# Patient Record
Sex: Female | Born: 1945 | Race: Black or African American | Hispanic: No | Marital: Married | Smoking: Never smoker
Health system: Southern US, Community
[De-identification: ages and names within clinical notes are randomized; demographics above are authoritative.]

## PROBLEM LIST (undated history)

## (undated) DIAGNOSIS — Z923 Personal history of irradiation: Secondary | ICD-10-CM

## (undated) DIAGNOSIS — E7800 Pure hypercholesterolemia, unspecified: Secondary | ICD-10-CM

## (undated) DIAGNOSIS — Z9221 Personal history of antineoplastic chemotherapy: Secondary | ICD-10-CM

## (undated) DIAGNOSIS — C50919 Malignant neoplasm of unspecified site of unspecified female breast: Secondary | ICD-10-CM

---

## 1993-12-27 HISTORY — PX: MASTECTOMY: SHX3

## 2014-01-02 ENCOUNTER — Other Ambulatory Visit: Payer: Self-pay

## 2014-01-02 DIAGNOSIS — Z1231 Encounter for screening mammogram for malignant neoplasm of breast: Secondary | ICD-10-CM

## 2014-01-04 ENCOUNTER — Other Ambulatory Visit: Payer: Self-pay

## 2014-01-04 ENCOUNTER — Ambulatory Visit
Admission: RE | Admit: 2014-01-04 | Discharge: 2014-01-04 | Disposition: A | Discharge status: Home / Self Care | Payer: Medicare Other | Source: Ambulatory Visit

## 2014-01-04 DIAGNOSIS — Z1231 Encounter for screening mammogram for malignant neoplasm of breast: Secondary | ICD-10-CM

## 2014-11-07 ENCOUNTER — Other Ambulatory Visit (HOSPITAL_COMMUNITY)
Admission: RE | Admit: 2014-11-07 | Discharge: 2014-11-07 | Disposition: A | Discharge status: Home / Self Care | Payer: Medicare Other | Source: Ambulatory Visit | Attending: Family Medicine | Admitting: Family Medicine

## 2014-11-07 ENCOUNTER — Other Ambulatory Visit: Payer: Self-pay | Admitting: Family Medicine

## 2014-11-07 DIAGNOSIS — Z01419 Encounter for gynecological examination (general) (routine) without abnormal findings: Secondary | ICD-10-CM | POA: Diagnosis present

## 2014-11-11 LAB — CYTOLOGY - PAP

## 2015-01-07 ENCOUNTER — Other Ambulatory Visit: Payer: Self-pay

## 2015-01-07 DIAGNOSIS — Z1231 Encounter for screening mammogram for malignant neoplasm of breast: Secondary | ICD-10-CM

## 2015-01-13 ENCOUNTER — Ambulatory Visit
Admission: RE | Admit: 2015-01-13 | Discharge: 2015-01-13 | Disposition: A | Discharge status: Home / Self Care | Payer: Medicare Other | Source: Ambulatory Visit

## 2015-01-13 ENCOUNTER — Other Ambulatory Visit: Payer: Self-pay

## 2015-01-13 DIAGNOSIS — Z1239 Encounter for other screening for malignant neoplasm of breast: Secondary | ICD-10-CM

## 2015-01-13 DIAGNOSIS — Z1231 Encounter for screening mammogram for malignant neoplasm of breast: Secondary | ICD-10-CM

## 2015-12-17 ENCOUNTER — Other Ambulatory Visit (HOSPITAL_COMMUNITY): Payer: Self-pay | Admitting: Nurse Practitioner

## 2015-12-17 DIAGNOSIS — B1920 Unspecified viral hepatitis C without hepatic coma: Secondary | ICD-10-CM

## 2016-01-21 ENCOUNTER — Ambulatory Visit (HOSPITAL_COMMUNITY)
Admission: RE | Admit: 2016-01-21 | Discharge: 2016-01-21 | Disposition: A | Discharge status: Home / Self Care | Payer: Medicare Other | Source: Ambulatory Visit | Attending: Nurse Practitioner | Admitting: Nurse Practitioner

## 2016-01-21 DIAGNOSIS — N281 Cyst of kidney, acquired: Secondary | ICD-10-CM | POA: Insufficient documentation

## 2016-01-21 DIAGNOSIS — B1920 Unspecified viral hepatitis C without hepatic coma: Secondary | ICD-10-CM

## 2016-02-23 ENCOUNTER — Other Ambulatory Visit: Payer: Self-pay

## 2016-02-23 DIAGNOSIS — Z1231 Encounter for screening mammogram for malignant neoplasm of breast: Secondary | ICD-10-CM

## 2016-03-17 ENCOUNTER — Ambulatory Visit
Admission: RE | Admit: 2016-03-17 | Discharge: 2016-03-17 | Disposition: A | Discharge status: Home / Self Care | Payer: Medicare Other | Source: Ambulatory Visit

## 2016-03-17 DIAGNOSIS — Z1231 Encounter for screening mammogram for malignant neoplasm of breast: Secondary | ICD-10-CM

## 2016-12-28 ENCOUNTER — Ambulatory Visit (INDEPENDENT_AMBULATORY_CARE_PROVIDER_SITE_OTHER): Payer: Medicare Other | Admitting: Podiatry

## 2016-12-28 ENCOUNTER — Encounter: Payer: Self-pay | Admitting: Podiatry

## 2016-12-28 VITALS — BP 171/88 | HR 72 | Ht 64.0 in | Wt 150.0 lb

## 2016-12-28 DIAGNOSIS — S91209A Unspecified open wound of unspecified toe(s) with damage to nail, initial encounter: Secondary | ICD-10-CM | POA: Diagnosis not present

## 2016-12-28 DIAGNOSIS — B351 Tinea unguium: Secondary | ICD-10-CM

## 2016-12-28 DIAGNOSIS — M79675 Pain in left toe(s): Secondary | ICD-10-CM

## 2016-12-28 DIAGNOSIS — M79674 Pain in right toe(s): Secondary | ICD-10-CM

## 2016-12-28 NOTE — Progress Notes (Signed)
SUBJECTIVE: 71 y.o. year old female presents complaining of painful loose nail. She keep bumping and nails and they are loose.  REVIEW OF SYSTEMS: Pertinent items noted in HPI and remainder of comprehensive ROS otherwise negative.  OBJECTIVE: DERMATOLOGIC EXAMINATION: Nails: Loose nail both great toe with fungal debris under nail plate.  VASCULAR EXAMINATION OF LOWER LIMBS: All pedal pulses are palpable with normal pulsation.  Capillary Filling times within 3 seconds in all digits.  No edema or erythema noted. Temperature gradient from tibial crest to dorsum of foot is within normal bilateral.  NEUROLOGIC EXAMINATION OF THE LOWER LIMBS: All epicritic and tactile sensations grossly intact.  Sharp and Dull discriminatory sensations at the plantar ball of hallux is intact bilateral.   MUSCULOSKELETAL EXAMINATION: Positive long and hyperextended great toe bilateral.   ASSESSMENT: Onychomycosis both great toe nail. Loose nail from microtrauma both great toes. Hyperextended both great toes. Painful toes.  PLAN: Reviewed findings and available treatment options. Reviewed proper shoe selection. All nails debrided. Return as needed.

## 2016-12-28 NOTE — Patient Instructions (Signed)
Deformed loose toe nails both great toe from hyperextended great toes. All nails debrided. Proper shoe selection reviewed. Return as needed.

## 2017-02-25 ENCOUNTER — Other Ambulatory Visit: Payer: Self-pay | Admitting: Family Medicine

## 2017-02-25 DIAGNOSIS — Z1231 Encounter for screening mammogram for malignant neoplasm of breast: Secondary | ICD-10-CM

## 2017-03-21 ENCOUNTER — Ambulatory Visit
Admission: RE | Admit: 2017-03-21 | Discharge: 2017-03-21 | Disposition: A | Discharge status: Home / Self Care | Payer: Medicare Other | Source: Ambulatory Visit | Attending: Family Medicine | Admitting: Family Medicine

## 2017-03-21 DIAGNOSIS — Z1231 Encounter for screening mammogram for malignant neoplasm of breast: Secondary | ICD-10-CM

## 2017-03-21 HISTORY — DX: Personal history of irradiation: Z92.3

## 2017-03-21 HISTORY — DX: Personal history of antineoplastic chemotherapy: Z92.21

## 2018-02-20 ENCOUNTER — Other Ambulatory Visit: Payer: Self-pay | Admitting: Family Medicine

## 2018-02-20 DIAGNOSIS — Z1231 Encounter for screening mammogram for malignant neoplasm of breast: Secondary | ICD-10-CM

## 2018-03-24 ENCOUNTER — Ambulatory Visit
Admission: RE | Admit: 2018-03-24 | Discharge: 2018-03-24 | Disposition: A | Discharge status: Home / Self Care | Payer: Medicare Other | Source: Ambulatory Visit | Attending: Family Medicine | Admitting: Family Medicine

## 2018-03-24 DIAGNOSIS — Z1231 Encounter for screening mammogram for malignant neoplasm of breast: Secondary | ICD-10-CM

## 2018-04-26 ENCOUNTER — Encounter: Payer: Self-pay | Admitting: Podiatry

## 2018-04-26 ENCOUNTER — Ambulatory Visit (INDEPENDENT_AMBULATORY_CARE_PROVIDER_SITE_OTHER): Payer: Medicare Other | Admitting: Podiatry

## 2018-04-26 DIAGNOSIS — M79675 Pain in left toe(s): Secondary | ICD-10-CM

## 2018-04-26 DIAGNOSIS — B351 Tinea unguium: Secondary | ICD-10-CM

## 2018-04-26 DIAGNOSIS — M79674 Pain in right toe(s): Secondary | ICD-10-CM | POA: Diagnosis not present

## 2018-04-26 NOTE — Patient Instructions (Signed)
Seen for hypertrophic nails. All nails debrided. Return as needed.  

## 2018-04-26 NOTE — Progress Notes (Signed)
Subjective: 72 y.o. year old female patient presents complaining of painful nails. Patient requests toe nails trimmed.   Objective: Dermatologic: Thick yellow deformed nails x 10. Vascular: Pedal pulses are all palpable. Orthopedic: Contracted lesser digits  Neurologic: All epicritic and tactile sensations grossly intact.  Assessment: Dystrophic mycotic nails x 10. Painful toes.  Treatment: All mycotic nails debrided.  Return in 3 months or as needed.

## 2018-08-07 ENCOUNTER — Ambulatory Visit (INDEPENDENT_AMBULATORY_CARE_PROVIDER_SITE_OTHER): Payer: Medicare Other | Admitting: Podiatry

## 2018-08-07 ENCOUNTER — Encounter: Payer: Self-pay | Admitting: Podiatry

## 2018-08-07 DIAGNOSIS — M79674 Pain in right toe(s): Secondary | ICD-10-CM | POA: Diagnosis not present

## 2018-08-07 DIAGNOSIS — B351 Tinea unguium: Secondary | ICD-10-CM | POA: Diagnosis not present

## 2018-08-07 DIAGNOSIS — M79675 Pain in left toe(s): Secondary | ICD-10-CM | POA: Diagnosis not present

## 2018-08-07 NOTE — Progress Notes (Signed)
Subjective: 72 y.o. year old female patient presents complaining of painful nails. Patient requests toe nails trimmed. Denies any new problems.  Objective: Dermatologic: Thick dark brown deformed nails x 10. Vascular: Pedal pulses are all palpable. Orthopedic: Contracted lesser digits bilateral. Neurologic: All epicritic and tactile sensations grossly intact.  Assessment: Dystrophic mycotic nails x 10. Painful feet.  Treatment: All mycotic nails debrided.  Return in 3 months or as needed.

## 2018-08-07 NOTE — Patient Instructions (Signed)
Seen for hypertrophic nails. All nails debrided. Return in 3 months or as needed.  

## 2018-11-08 ENCOUNTER — Ambulatory Visit: Payer: Medicare Other | Admitting: Podiatry

## 2019-01-02 ENCOUNTER — Other Ambulatory Visit: Payer: Self-pay | Admitting: Family Medicine

## 2019-01-03 ENCOUNTER — Other Ambulatory Visit: Payer: Self-pay | Admitting: Family Medicine

## 2019-01-03 DIAGNOSIS — R140 Abdominal distension (gaseous): Secondary | ICD-10-CM

## 2019-01-12 ENCOUNTER — Ambulatory Visit
Admission: RE | Admit: 2019-01-12 | Discharge: 2019-01-12 | Disposition: A | Discharge status: Home / Self Care | Payer: Medicare Other | Source: Ambulatory Visit | Attending: Family Medicine | Admitting: Family Medicine

## 2019-01-12 DIAGNOSIS — R140 Abdominal distension (gaseous): Secondary | ICD-10-CM

## 2019-03-06 ENCOUNTER — Other Ambulatory Visit: Payer: Self-pay | Admitting: Family Medicine

## 2019-03-06 DIAGNOSIS — Z1231 Encounter for screening mammogram for malignant neoplasm of breast: Secondary | ICD-10-CM

## 2019-03-30 ENCOUNTER — Ambulatory Visit: Payer: Medicare Other

## 2019-05-17 ENCOUNTER — Ambulatory Visit
Admission: RE | Admit: 2019-05-17 | Discharge: 2019-05-17 | Disposition: A | Discharge status: Home / Self Care | Payer: Medicare Other | Source: Ambulatory Visit | Attending: Family Medicine | Admitting: Family Medicine

## 2019-05-17 ENCOUNTER — Other Ambulatory Visit: Payer: Self-pay

## 2019-05-17 DIAGNOSIS — Z1231 Encounter for screening mammogram for malignant neoplasm of breast: Secondary | ICD-10-CM

## 2019-05-17 HISTORY — DX: Malignant neoplasm of unspecified site of unspecified female breast: C50.919

## 2019-05-29 ENCOUNTER — Other Ambulatory Visit: Payer: Self-pay | Admitting: Obstetrics and Gynecology

## 2019-05-29 ENCOUNTER — Other Ambulatory Visit (HOSPITAL_COMMUNITY)
Admission: RE | Admit: 2019-05-29 | Discharge: 2019-05-29 | Disposition: A | Discharge status: Home / Self Care | Payer: Medicare Other | Source: Ambulatory Visit | Attending: Obstetrics and Gynecology | Admitting: Obstetrics and Gynecology

## 2019-05-29 DIAGNOSIS — Z124 Encounter for screening for malignant neoplasm of cervix: Secondary | ICD-10-CM | POA: Diagnosis present

## 2019-05-31 LAB — CYTOLOGY - PAP
Diagnosis: NEGATIVE
HPV: NOT DETECTED

## 2020-01-31 ENCOUNTER — Other Ambulatory Visit: Payer: Self-pay | Admitting: Family Medicine

## 2020-01-31 DIAGNOSIS — Z1231 Encounter for screening mammogram for malignant neoplasm of breast: Secondary | ICD-10-CM

## 2020-01-31 DIAGNOSIS — E2839 Other primary ovarian failure: Secondary | ICD-10-CM

## 2020-05-19 ENCOUNTER — Ambulatory Visit
Admission: RE | Admit: 2020-05-19 | Discharge: 2020-05-19 | Disposition: A | Discharge status: Home / Self Care | Payer: Medicare Other | Source: Ambulatory Visit | Attending: Family Medicine | Admitting: Family Medicine

## 2020-05-19 ENCOUNTER — Other Ambulatory Visit: Payer: Self-pay

## 2020-05-19 DIAGNOSIS — Z1231 Encounter for screening mammogram for malignant neoplasm of breast: Secondary | ICD-10-CM

## 2020-05-19 DIAGNOSIS — E2839 Other primary ovarian failure: Secondary | ICD-10-CM

## 2020-05-20 ENCOUNTER — Other Ambulatory Visit: Payer: Self-pay | Admitting: Family Medicine

## 2020-05-20 DIAGNOSIS — R928 Other abnormal and inconclusive findings on diagnostic imaging of breast: Secondary | ICD-10-CM

## 2020-05-29 ENCOUNTER — Ambulatory Visit
Admission: RE | Admit: 2020-05-29 | Discharge: 2020-05-29 | Disposition: A | Discharge status: Home / Self Care | Payer: Medicare Other | Source: Ambulatory Visit | Attending: Family Medicine | Admitting: Family Medicine

## 2020-05-29 ENCOUNTER — Other Ambulatory Visit: Payer: Self-pay

## 2020-05-29 DIAGNOSIS — R928 Other abnormal and inconclusive findings on diagnostic imaging of breast: Secondary | ICD-10-CM

## 2021-01-08 DIAGNOSIS — I10 Essential (primary) hypertension: Secondary | ICD-10-CM | POA: Diagnosis not present

## 2021-01-08 DIAGNOSIS — E785 Hyperlipidemia, unspecified: Secondary | ICD-10-CM | POA: Diagnosis not present

## 2021-01-08 DIAGNOSIS — Z853 Personal history of malignant neoplasm of breast: Secondary | ICD-10-CM | POA: Diagnosis not present

## 2021-01-15 DIAGNOSIS — E785 Hyperlipidemia, unspecified: Secondary | ICD-10-CM | POA: Diagnosis not present

## 2021-01-15 DIAGNOSIS — I10 Essential (primary) hypertension: Secondary | ICD-10-CM | POA: Diagnosis not present

## 2021-02-04 DIAGNOSIS — H10532 Contact blepharoconjunctivitis, left eye: Secondary | ICD-10-CM | POA: Diagnosis not present

## 2021-03-03 ENCOUNTER — Emergency Department (HOSPITAL_BASED_OUTPATIENT_CLINIC_OR_DEPARTMENT_OTHER)
Admission: EM | Admit: 2021-03-03 | Discharge: 2021-03-03 | Disposition: A | Discharge status: Home / Self Care | Payer: Medicare Other | Attending: Emergency Medicine | Admitting: Emergency Medicine

## 2021-03-03 ENCOUNTER — Other Ambulatory Visit: Payer: Self-pay

## 2021-03-03 ENCOUNTER — Encounter (HOSPITAL_BASED_OUTPATIENT_CLINIC_OR_DEPARTMENT_OTHER): Payer: Self-pay | Admitting: *Deleted

## 2021-03-03 DIAGNOSIS — Z853 Personal history of malignant neoplasm of breast: Secondary | ICD-10-CM | POA: Diagnosis not present

## 2021-03-03 DIAGNOSIS — J309 Allergic rhinitis, unspecified: Secondary | ICD-10-CM

## 2021-03-03 DIAGNOSIS — R519 Headache, unspecified: Secondary | ICD-10-CM | POA: Diagnosis present

## 2021-03-03 MED ORDER — FLUTICASONE PROPIONATE 50 MCG/ACT NA SUSP: 2.0000 | Freq: Every day | NASAL | 1 refills | Status: DC

## 2021-03-03 MED ORDER — PSEUDOEPHEDRINE HCL ER 120 MG PO TB12: 120.0000 mg | ORAL_TABLET | Freq: Two times a day (BID) | ORAL | 0 refills | Status: DC

## 2021-03-03 MED ORDER — LORATADINE 10 MG PO TABS: 10.0000 mg | ORAL_TABLET | Freq: Every day | ORAL | 0 refills | Status: DC

## 2021-03-03 NOTE — ED Triage Notes (Signed)
Feeling fullness in head, sinus discomfort, onset approx 1 week ago, now has sensitivity to light

## 2021-03-03 NOTE — ED Provider Notes (Signed)
[facility] EMERGENCY DEPARTMENT Provider Note   CSN: [id number] Arrival date & time: 03/03/21  0830     History Chief Complaint  Patient presents with  . sinus infection    Sandra Herrera is a 75 y.o. female.  HPI   Patient presents to the ER for evaluation of headache sinus discomfort and difficulty breathing through her nose.  Patient's symptoms started about a week ago.  She is also had some fullness in her head.  She has tried some nasal spray that her husband had.  She is not exactly sure the name.  She had some sensitivity to the light in her left eye was bothering her this morning.  That has resolved now.  Because of those symptoms she came to the ED.  She is not having a headache.  No facial droop.  No numbness or weakness.  No fevers or chills.  No purulent nasal drainage.  She does have history of allergies and sinus symptoms  Past Medical History:  Diagnosis Date  . Breast cancer ([place])    left mastectomy 1995  . Personal history of chemotherapy   . Personal history of radiation therapy     There are no problems to display for this patient.   Past Surgical History:  Procedure Laterality Date  . MASTECTOMY Left 1995     OB History   No obstetric history on file.     Family History  Problem Relation Age of Onset  . Breast cancer Maternal Aunt     Social History   Tobacco Use  . Smoking status: Never Smoker  . Smokeless tobacco: Never Used    Home Medications Prior to Admission medications   Medication Sig Start Date End Date Taking? Authorizing Provider  ezetimibe (ZETIA) 10 MG tablet 1 tablet 11/18/15  Yes [provider]  fluticasone (FLONASE) 50 MCG/ACT nasal spray Place 2 sprays into both nostrils daily for 7 days. 03/03/21 03/10/21 Yes Dorie Rank, MD  hydrochlorothiazide (HYDRODIURIL) 12.5 MG tablet 1 tablet   Yes [provider]  loratadine (CLARITIN) 10 MG tablet Take 1 tablet (10 mg total) by mouth daily.  03/03/21  Yes Dorie Rank, MD  meclizine (ANTIVERT) 25 MG tablet 1 tablet as needed   Yes [provider]  pseudoephedrine (SUDAFED 12 HOUR) 120 MG 12 hr tablet Take 1 tablet (120 mg total) by mouth every 12 (twelve) hours. 03/03/21  Yes Dorie Rank, MD  amoxicillin (AMOXIL) 500 MG capsule Take 500 mg by mouth 3 (three) times daily. 01/09/21   [provider]  cefdinir (OMNICEF) 300 MG capsule  10/10/16   [provider]  chlorhexidine (PERIDEX) 0.12 % solution 15 mLs 2 (two) times daily. 01/19/21   [provider]  ibuprofen (ADVIL,MOTRIN) 600 MG tablet  10/13/16   [provider]  methylPREDNISolone (MEDROL DOSEPAK) 4 MG TBPK tablet follow package directions 10/10/16   [provider]  VITAMIN D, CHOLECALCIFEROL, PO Take by mouth.    [provider]    Allergies    Patient has no known allergies.  Review of Systems   Review of Systems  All other systems reviewed and are negative.   Physical Exam Updated Vital Signs BP (!) 143/83 (BP Location: Right Arm)   Pulse 78   Temp 98.1 F (36.7 C) (Oral)   Resp 16   Ht 1.626 m (5\' 4" )   Wt 66.2 kg   SpO2 100%   BMI 25.06 kg/m   Physical Exam Vitals  and nursing note reviewed.  Constitutional:      General: She is not in acute distress.    Appearance: She is well-developed and well-nourished.  HENT:     Head: Normocephalic and atraumatic.     Right Ear: External ear normal.     Left Ear: External ear normal.     Nose:     Comments: No sinus tenderness palpation, no nasal drainage Eyes:     General: No scleral icterus.       Right eye: No discharge.        Left eye: No discharge.     Extraocular Movements: Extraocular movements intact.     Conjunctiva/sclera: Conjunctivae normal.     Pupils: Pupils are equal, round, and reactive to light.  Neck:     Trachea: No tracheal deviation.  Cardiovascular:     Rate and Rhythm: Normal rate and regular rhythm.     Pulses: Intact  distal pulses.  Pulmonary:     Effort: Pulmonary effort is normal. No respiratory distress.     Breath sounds: Normal breath sounds. No stridor. No wheezing or rales.  Abdominal:     General: Bowel sounds are normal. There is no distension.     Palpations: Abdomen is soft.     Tenderness: There is no abdominal tenderness. There is no guarding or rebound.  Musculoskeletal:        General: No tenderness or edema.     Cervical back: Neck supple.  Skin:    General: Skin is warm and dry.     Findings: No rash.  Neurological:     Mental Status: She is alert.     Cranial Nerves: No cranial nerve deficit (no facial droop, extraocular movements intact, no slurred speech).     Sensory: No sensory deficit.     Motor: No abnormal muscle tone or seizure activity.     Coordination: Coordination normal.     Deep Tendon Reflexes: Strength normal.  Psychiatric:        Mood and Affect: Mood and affect normal.     ED Results / Procedures / Treatments   Labs (all labs ordered are listed, but only abnormal results are displayed) Labs Reviewed - No data to display  EKG None  Radiology No results found.  Procedures Procedures   Medications Ordered in ED Medications - No data to display  ED Course  I have reviewed the triage vital signs and the nursing notes.  Pertinent labs & imaging results that were available during my care of the patient were reviewed by me and considered in my medical decision making (see chart for details).    MDM Rules/Calculators/A&P                          Patient does not have any photophobia on exam right now.  Her evaluation is otherwise unremarkable.  Patient does states she goes to her eye doctor and has routine checkups.  She does not have a history of glaucoma or other eye abnormalities.  I doubt acute ocular event as her symptoms have all resolved and her eye exam is normal.  Patient is having sinus type symptoms.  The pollen count is high at this time  of the year.  Suspect this could be contributing to her symptoms.  Doubt bacterial sinusitis.  Will discharge home on decongestants as well as antihistamines and nasal steroids.  Outpatient follow-up discussed. Final Clinical Impression(s) / ED Diagnoses  Final diagnoses:  Allergic sinusitis    Rx / DC Orders ED Discharge Orders         Ordered    pseudoephedrine (SUDAFED 12 HOUR) 120 MG 12 hr tablet  Every 12 hours        03/03/21 1013    fluticasone (FLONASE) 50 MCG/ACT nasal spray  Daily        03/03/21 1013    loratadine (CLARITIN) 10 MG tablet  Daily        03/03/21 1013           Dorie Rank, MD 03/03/21 1014

## 2021-03-03 NOTE — Discharge Instructions (Addendum)
Take the medications as prescribed.  Follow-up with your doctor if not improving.  Consider following up with your eye doctor if you have any recurrent issues with your eye

## 2021-03-23 ENCOUNTER — Emergency Department (HOSPITAL_BASED_OUTPATIENT_CLINIC_OR_DEPARTMENT_OTHER)
Admission: EM | Admit: 2021-03-23 | Discharge: 2021-03-23 | Disposition: A | Discharge status: Home / Self Care | Payer: Medicare Other | Attending: Emergency Medicine | Admitting: Emergency Medicine

## 2021-03-23 ENCOUNTER — Other Ambulatory Visit: Payer: Self-pay

## 2021-03-23 ENCOUNTER — Encounter (HOSPITAL_BASED_OUTPATIENT_CLINIC_OR_DEPARTMENT_OTHER): Payer: Self-pay | Admitting: Emergency Medicine

## 2021-03-23 ENCOUNTER — Other Ambulatory Visit (HOSPITAL_COMMUNITY): Payer: Self-pay | Admitting: Emergency Medicine

## 2021-03-23 DIAGNOSIS — H1033 Unspecified acute conjunctivitis, bilateral: Secondary | ICD-10-CM | POA: Diagnosis not present

## 2021-03-23 DIAGNOSIS — Z20822 Contact with and (suspected) exposure to covid-19: Secondary | ICD-10-CM | POA: Insufficient documentation

## 2021-03-23 DIAGNOSIS — R0981 Nasal congestion: Secondary | ICD-10-CM | POA: Diagnosis present

## 2021-03-23 DIAGNOSIS — Z853 Personal history of malignant neoplasm of breast: Secondary | ICD-10-CM | POA: Insufficient documentation

## 2021-03-23 DIAGNOSIS — R040 Epistaxis: Secondary | ICD-10-CM | POA: Insufficient documentation

## 2021-03-23 DIAGNOSIS — J019 Acute sinusitis, unspecified: Secondary | ICD-10-CM | POA: Diagnosis not present

## 2021-03-23 LAB — SARS CORONAVIRUS 2 (TAT 6-24 HRS): SARS Coronavirus 2: NEGATIVE

## 2021-03-23 MED ORDER — POLYMYXIN B-TRIMETHOPRIM 10000-0.1 UNIT/ML-% OP SOLN
1.0000 [drp] | Freq: Four times a day (QID) | OPHTHALMIC | Status: DC
  Administered 2021-03-23: 1 [drp] via OPHTHALMIC

## 2021-03-23 MED ORDER — AMOXICILLIN 500 MG PO CAPS: 500.0000 mg | ORAL_CAPSULE | Freq: Three times a day (TID) | ORAL | 0 refills | Status: DC

## 2021-03-23 MED FILL — AMOXICILLIN 500 MG CAPSULE: 500 | 7 days supply | Qty: 21 | Fill #0

## 2021-03-23 NOTE — ED Provider Notes (Signed)
[facility] EMERGENCY DEPARTMENT Provider Note   CSN: [id number] Arrival date & time: 03/23/21  0165     History Chief Complaint  Patient presents with  . Eye Pain    Sandra Herrera is a 75 y.o. female.  Patient is a 75 year old female with a history of breast cancer status post mastectomy and chemotherapy who is presenting today with several symptoms.  Patient has had nasal congestion, eye pain, drainage and cough for almost 10 days now that makes it hard for her to breathe out of her nose.  She is started to have nosebleeds and bloody nasal secretions.  She is not aware of any fevers.  In the last 8 days she has had some itching and burning in the left eye with watering but then starting yesterday she developed redness, irritation and intermittent pain in the right eye with constant tearing.  She denies any exudate or mucus secretion from the eyes.  She was seen several weeks ago and told to take Sudafed which she reports is not helping.  She does not usually suffer from allergies.  She denies any chest pain or shortness of breath.  She has no visual changes but is slightly sensitive to light.  The history is provided by the patient.  Eye Pain       Past Medical History:  Diagnosis Date  . Breast cancer ([place])    left mastectomy 1995  . Personal history of chemotherapy   . Personal history of radiation therapy     There are no problems to display for this patient.   Past Surgical History:  Procedure Laterality Date  . MASTECTOMY Left 1995     OB History   No obstetric history on file.     Family History  Problem Relation Age of Onset  . Breast cancer Maternal Aunt     Social History   Tobacco Use  . Smoking status: Never Smoker  . Smokeless tobacco: Never Used    Home Medications Prior to Admission medications   Medication Sig Start Date End Date Taking? Authorizing Provider  amoxicillin (AMOXIL) 500 MG capsule Take 1 capsule (500 mg  total) by mouth 3 (three) times daily. 03/23/21  Yes Angeline Trick, Loree Fee, MD  chlorhexidine (PERIDEX) 0.12 % solution 15 mLs 2 (two) times daily. 01/19/21   [provider]  ezetimibe (ZETIA) 10 MG tablet 1 tablet 11/18/15   [provider]  fluticasone (FLONASE) 50 MCG/ACT nasal spray Place 2 sprays into both nostrils daily for 7 days. 03/03/21 03/10/21  Dorie Rank, MD  hydrochlorothiazide (HYDRODIURIL) 12.5 MG tablet 1 tablet    [provider]  ibuprofen (ADVIL,MOTRIN) 600 MG tablet  10/13/16   [provider]  loratadine (CLARITIN) 10 MG tablet Take 1 tablet (10 mg total) by mouth daily. 03/03/21   Dorie Rank, MD  meclizine (ANTIVERT) 25 MG tablet 1 tablet as needed    [provider]  methylPREDNISolone (MEDROL DOSEPAK) 4 MG TBPK tablet follow package directions 10/10/16   [provider]  pseudoephedrine (SUDAFED 12 HOUR) 120 MG 12 hr tablet Take 1 tablet (120 mg total) by mouth every 12 (twelve) hours. 03/03/21   Dorie Rank, MD  VITAMIN D, CHOLECALCIFEROL, PO Take by mouth.    [provider]    Allergies    Patient has no known allergies.  Review of Systems   Review of Systems  Eyes: Positive for pain.  All other systems reviewed and are negative.   Physical Exam  Updated Vital Signs BP (!) 171/96 (BP Location: Right Arm)   Pulse 78   Temp 98.3 F (36.8 C) (Oral)   Resp 16   Ht 5\' 4"  (1.626 m)   Wt 63.5 kg   SpO2 100%   BMI 24.03 kg/m   Physical Exam Vitals and nursing note reviewed.  Constitutional:      General: She is not in acute distress.    Appearance: She is well-developed.  HENT:     Head: Normocephalic and atraumatic.     Right Ear: Tympanic membrane normal.     Left Ear: Tympanic membrane normal.     Nose: Congestion present.     Comments: Fresh epistaxis from the left nare.  Swollen nasal turbinates    Mouth/Throat:     Mouth: Mucous membranes are moist.  Eyes:     Conjunctiva/sclera:     Right  eye: Right conjunctiva is injected. No exudate.    Left eye: No exudate.    Pupils: Pupils are equal, round, and reactive to light.     Comments: Constant tearing from the right eye.  Pupil is round and reactive.  Lids are normal.  Vision is normal per patient's report bilaterally  Cardiovascular:     Rate and Rhythm: Normal rate and regular rhythm.     Heart sounds: Normal heart sounds. No murmur heard. No friction rub.  Pulmonary:     Effort: Pulmonary effort is normal.     Breath sounds: Normal breath sounds. No wheezing or rales.  Musculoskeletal:        General: No tenderness. Normal range of motion.     Cervical back: Normal range of motion and neck supple.     Comments: No edema  Skin:    General: Skin is warm and dry.     Findings: No rash.  Neurological:     Mental Status: She is alert and oriented to person, place, and time. Mental status is at baseline.     Cranial Nerves: No cranial nerve deficit.  Psychiatric:        Mood and Affect: Mood normal.        Behavior: Behavior normal.     ED Results / Procedures / Treatments   Labs (all labs ordered are listed, but only abnormal results are displayed) Labs Reviewed  SARS CORONAVIRUS 2 (TAT 6-24 HRS)    EKG None  Radiology No results found.  Procedures Procedures   Medications Ordered in ED Medications  trimethoprim-polymyxin b (POLYTRIM) ophthalmic solution 1 drop (1 drop Both Eyes Given 03/23/21 0917)    ED Course  I have reviewed the triage vital signs and the nursing notes.  Pertinent labs & imaging results that were available during my care of the patient were reviewed by me and considered in my medical decision making (see chart for details).    MDM Rules/Calculators/A&P                          Pt with symptoms consistent with subacute sinusitis and conjunctivis.  Well appearing here.  No signs of breathing difficulty  No signs of pharyngitis, otitis or abnormal abdominal findings.   Pt is  starting to have bloody secretions and no report of seasonal allergies.  Patient's pupils are reactive bilaterally, right eye is injected but normal range of motion no evidence of lid edema or concern for orbital cellulitis.  Low suspicion for glaucoma.  Patient requests Covid testing which was done.  Will start on amoxicillin for concern for developing bacterial sinusitis and given Polytrim drops for the conjunctivitis.  Encouraged follow-up if symptoms do not improve.  Final Clinical Impression(s) / ED Diagnoses Final diagnoses:  Subacute sinusitis, unspecified location  Acute conjunctivitis of both eyes, unspecified acute conjunctivitis type    Rx / DC Orders ED Discharge Orders         Ordered    amoxicillin (AMOXIL) 500 MG capsule  3 times daily        03/23/21 0905           Blanchie Dessert, MD 03/23/21 [phone]

## 2021-03-23 NOTE — ED Triage Notes (Signed)
Eyes hurting , red and light sensitive  X 2 days  watery

## 2021-03-26 DIAGNOSIS — E785 Hyperlipidemia, unspecified: Secondary | ICD-10-CM | POA: Diagnosis not present

## 2021-03-26 DIAGNOSIS — H04123 Dry eye syndrome of bilateral lacrimal glands: Secondary | ICD-10-CM | POA: Diagnosis not present

## 2021-03-26 DIAGNOSIS — I10 Essential (primary) hypertension: Secondary | ICD-10-CM | POA: Diagnosis not present

## 2021-03-26 DIAGNOSIS — Z853 Personal history of malignant neoplasm of breast: Secondary | ICD-10-CM | POA: Diagnosis not present

## 2021-04-22 ENCOUNTER — Other Ambulatory Visit: Payer: Self-pay | Admitting: Family Medicine

## 2021-04-22 DIAGNOSIS — Z1231 Encounter for screening mammogram for malignant neoplasm of breast: Secondary | ICD-10-CM

## 2021-05-26 DIAGNOSIS — C50912 Malignant neoplasm of unspecified site of left female breast: Secondary | ICD-10-CM | POA: Diagnosis not present

## 2021-05-28 DIAGNOSIS — H52223 Regular astigmatism, bilateral: Secondary | ICD-10-CM | POA: Diagnosis not present

## 2021-05-28 DIAGNOSIS — H524 Presbyopia: Secondary | ICD-10-CM | POA: Diagnosis not present

## 2021-05-28 DIAGNOSIS — Z961 Presence of intraocular lens: Secondary | ICD-10-CM | POA: Diagnosis not present

## 2021-05-28 DIAGNOSIS — H04123 Dry eye syndrome of bilateral lacrimal glands: Secondary | ICD-10-CM | POA: Diagnosis not present

## 2021-05-28 DIAGNOSIS — H10413 Chronic giant papillary conjunctivitis, bilateral: Secondary | ICD-10-CM | POA: Diagnosis not present

## 2021-06-11 ENCOUNTER — Other Ambulatory Visit: Payer: Self-pay

## 2021-06-11 ENCOUNTER — Ambulatory Visit
Admission: RE | Admit: 2021-06-11 | Discharge: 2021-06-11 | Disposition: A | Discharge status: Home / Self Care | Payer: Medicare Other | Source: Ambulatory Visit | Attending: Family Medicine | Admitting: Family Medicine

## 2021-06-11 DIAGNOSIS — Z1231 Encounter for screening mammogram for malignant neoplasm of breast: Secondary | ICD-10-CM

## 2021-06-15 DIAGNOSIS — E785 Hyperlipidemia, unspecified: Secondary | ICD-10-CM | POA: Diagnosis not present

## 2021-06-15 DIAGNOSIS — I10 Essential (primary) hypertension: Secondary | ICD-10-CM | POA: Diagnosis not present

## 2021-06-15 DIAGNOSIS — Z853 Personal history of malignant neoplasm of breast: Secondary | ICD-10-CM | POA: Diagnosis not present

## 2021-07-15 DIAGNOSIS — Z0000 Encounter for general adult medical examination without abnormal findings: Secondary | ICD-10-CM | POA: Diagnosis not present

## 2021-07-15 DIAGNOSIS — E785 Hyperlipidemia, unspecified: Secondary | ICD-10-CM | POA: Diagnosis not present

## 2021-07-15 DIAGNOSIS — F5101 Primary insomnia: Secondary | ICD-10-CM | POA: Diagnosis not present

## 2021-07-15 DIAGNOSIS — I10 Essential (primary) hypertension: Secondary | ICD-10-CM | POA: Diagnosis not present

## 2021-07-15 DIAGNOSIS — E559 Vitamin D deficiency, unspecified: Secondary | ICD-10-CM | POA: Diagnosis not present

## 2021-07-30 ENCOUNTER — Encounter (HOSPITAL_BASED_OUTPATIENT_CLINIC_OR_DEPARTMENT_OTHER): Payer: Self-pay | Admitting: *Deleted

## 2021-07-30 ENCOUNTER — Other Ambulatory Visit (HOSPITAL_BASED_OUTPATIENT_CLINIC_OR_DEPARTMENT_OTHER): Payer: Self-pay

## 2021-07-30 ENCOUNTER — Other Ambulatory Visit: Payer: Self-pay

## 2021-07-30 ENCOUNTER — Emergency Department (HOSPITAL_BASED_OUTPATIENT_CLINIC_OR_DEPARTMENT_OTHER)
Admission: EM | Admit: 2021-07-30 | Discharge: 2021-07-30 | Disposition: A | Discharge status: Home / Self Care | Payer: Medicare Other | Attending: Emergency Medicine | Admitting: Emergency Medicine

## 2021-07-30 DIAGNOSIS — H04123 Dry eye syndrome of bilateral lacrimal glands: Secondary | ICD-10-CM | POA: Diagnosis not present

## 2021-07-30 DIAGNOSIS — Z853 Personal history of malignant neoplasm of breast: Secondary | ICD-10-CM | POA: Insufficient documentation

## 2021-07-30 DIAGNOSIS — Z20822 Contact with and (suspected) exposure to covid-19: Secondary | ICD-10-CM | POA: Diagnosis not present

## 2021-07-30 DIAGNOSIS — H9202 Otalgia, left ear: Secondary | ICD-10-CM | POA: Insufficient documentation

## 2021-07-30 DIAGNOSIS — H1045 Other chronic allergic conjunctivitis: Secondary | ICD-10-CM | POA: Diagnosis not present

## 2021-07-30 LAB — SARS CORONAVIRUS 2 (TAT 6-24 HRS): SARS Coronavirus 2: NEGATIVE

## 2021-07-30 MED ORDER — AMOXICILLIN 500 MG PO CAPS
500.0000 mg | ORAL_CAPSULE | Freq: Two times a day (BID) | ORAL | 0 refills | Status: AC
  Filled 2021-07-30: qty 14, 7d supply, fill #0

## 2021-07-30 NOTE — Discharge Instructions (Addendum)
Please take the antibiotics as directed and follow with your PCP.

## 2021-07-30 NOTE — ED Provider Notes (Signed)
Emergency Department Provider Note   I have reviewed the triage vital signs and the nursing notes.   HISTORY  Chief Complaint Ear Drainage   HPI Sandra Herrera is a 75 y.o. female presents to the emergency department with left ear discomfort and some drainage.  She reports white drainage on the pillow in the morning coming from the left ear.  She has a history of vertigo and has developed some mild vertigo symptoms at home along with nasal congestion.  She is not having chest pain or shortness of breath.  She took a meclizine today and her vertigo symptoms have improved.  No other neurologic symptoms.   Past Medical History:  Diagnosis Date   Breast cancer ([place])    left mastectomy 1995   Personal history of chemotherapy    Personal history of radiation therapy     There are no problems to display for this patient.   Past Surgical History:  Procedure Laterality Date   MASTECTOMY Left 1995    Allergies Patient has no known allergies.  Family History  Problem Relation Age of Onset   Breast cancer Maternal Aunt     Social History Social History   Tobacco Use   Smoking status: Never   Smokeless tobacco: Never  Substance Use Topics   Alcohol use: Never   Drug use: Never    Review of Systems  Constitutional: No fever/chills Eyes: No visual changes. ENT: No sore throat. Drainage from left ear. Positive vertigo.  Cardiovascular: Denies chest pain. Respiratory: Denies shortness of breath. Gastrointestinal: No abdominal pain.  No nausea, no vomiting.  No diarrhea.  No constipation. Genitourinary: Negative for dysuria. Musculoskeletal: Negative for back pain. Skin: Negative for rash. Neurological: Negative for headaches, focal weakness or numbness.  10-point ROS otherwise negative.  ____________________________________________   PHYSICAL EXAM:  VITAL SIGNS: ED Triage Vitals  Enc Vitals Group     BP 07/30/21 1419 (!) 167/103     Pulse Rate  07/30/21 1419 75     Resp 07/30/21 1419 18     Temp 07/30/21 1419 98.3 F (36.8 C)     Temp Source 07/30/21 1419 Oral     SpO2 07/30/21 1419 100 %     Weight 07/30/21 1418 139 lb 15.9 oz (63.5 kg)     Height 07/30/21 1418 '5\' 4"'$  (1.626 m)   Constitutional: Alert and oriented. Well appearing and in no acute distress. Eyes: Conjunctivae are normal. Head: Atraumatic. Ears:  Healthy appearing ear canals bilaterally. No canal drainage or inflammation. Mild erythema and hazy left TM.  Nose: No congestion/rhinnorhea. Mouth/Throat: Mucous membranes are moist.  Oropharynx non-erythematous. Neck: No stridor.   Cardiovascular: Normal rate, regular rhythm. Good peripheral circulation. Grossly normal heart sounds.   Respiratory: Normal respiratory effort.  No retractions. Lungs CTAB. Gastrointestinal: No distention.  Musculoskeletal: No gross deformities of extremities. Neurologic:  Normal speech and language. No gross focal neurologic deficits are appreciated.  Skin:  Skin is warm, dry and intact. No rash noted.  ____________________________________________   PROCEDURES  Procedure(s) performed:   Procedures  None  ____________________________________________   INITIAL IMPRESSION / ASSESSMENT AND PLAN / ED COURSE  Pertinent labs & imaging results that were available during my care of the patient were reviewed by me and considered in my medical decision making (see chart for details).   Patient presents the emergency department with left ear discomfort with some drainage.  I do not see a perforated TM or otitis externa on exam.  Some mild erythema with haziness of the TM along with vertigo symptoms have me suspicious for possible early otitis media.  Plan to cover with amoxicillin.  Patient to continue with her meclizine.  No other symptoms or findings to suspect central vertigo process.    ____________________________________________  FINAL CLINICAL IMPRESSION(S) / ED  DIAGNOSES  Final diagnoses:  Left ear pain    NEW OUTPATIENT MEDICATIONS STARTED DURING THIS VISIT:  New Prescriptions   AMOXICILLIN (AMOXIL) 500 MG CAPSULE    Take 1 capsule (500 mg total) by mouth 2 (two) times daily for 7 days.    Note:  This document was prepared using Dragon voice recognition software and may include unintentional dictation errors.  Nanda Quinton, MD, [facility]    Olanna Percifield, [facility], MD 07/30/21 [phone]

## 2021-07-30 NOTE — ED Triage Notes (Signed)
States she has had fluid from her left ear for the past 2 nights. She has had dizziness today. Hx of vertigo.

## 2021-07-30 NOTE — ED Notes (Signed)
ED Provider at bedside. 

## 2021-08-08 ENCOUNTER — Emergency Department (HOSPITAL_BASED_OUTPATIENT_CLINIC_OR_DEPARTMENT_OTHER)
Admission: EM | Admit: 2021-08-08 | Discharge: 2021-08-08 | Disposition: A | Discharge status: Home / Self Care | Payer: Medicare Other | Attending: Emergency Medicine | Admitting: Emergency Medicine

## 2021-08-08 ENCOUNTER — Other Ambulatory Visit: Payer: Self-pay

## 2021-08-08 ENCOUNTER — Emergency Department (HOSPITAL_BASED_OUTPATIENT_CLINIC_OR_DEPARTMENT_OTHER): Payer: Medicare Other

## 2021-08-08 ENCOUNTER — Encounter (HOSPITAL_BASED_OUTPATIENT_CLINIC_OR_DEPARTMENT_OTHER): Payer: Self-pay | Admitting: Emergency Medicine

## 2021-08-08 DIAGNOSIS — R509 Fever, unspecified: Secondary | ICD-10-CM | POA: Diagnosis not present

## 2021-08-08 DIAGNOSIS — U071 COVID-19: Secondary | ICD-10-CM | POA: Diagnosis not present

## 2021-08-08 DIAGNOSIS — Z853 Personal history of malignant neoplasm of breast: Secondary | ICD-10-CM | POA: Diagnosis not present

## 2021-08-08 DIAGNOSIS — R059 Cough, unspecified: Secondary | ICD-10-CM | POA: Diagnosis not present

## 2021-08-08 LAB — BASIC METABOLIC PANEL
Anion gap: 9 (ref 5–15)
BUN: 9 mg/dL (ref 8–23)
CO2: 25 mmol/L (ref 22–32)
Calcium: 9.7 mg/dL (ref 8.9–10.3)
Chloride: 104 mmol/L (ref 98–111)
Creatinine, Ser: 0.71 mg/dL (ref 0.44–1.00)
GFR, Estimated: >60 mL/min (ref >60)
Glucose, Bld: 90 mg/dL (ref 70–99)
Potassium: 3.7 mmol/L (ref 3.5–5.1)
Sodium: 138 mmol/L (ref 135–145)

## 2021-08-08 LAB — RESP PANEL BY RT-PCR (FLU A&B, COVID) ARPGX2
Influenza A by PCR: NEGATIVE
Influenza B by PCR: NEGATIVE
SARS Coronavirus 2 by RT PCR: POSITIVE — AB

## 2021-08-08 MED ORDER — NIRMATRELVIR/RITONAVIR (PAXLOVID)TABLET: 3.0000 | ORAL_TABLET | Freq: Two times a day (BID) | ORAL | 0 refills | Status: AC

## 2021-08-08 NOTE — ED Provider Notes (Signed)
Emergency Department Provider Note   I have reviewed the triage vital signs and the nursing notes.   HISTORY  Chief Complaint Cough   HPI Sandra Herrera is a 75 y.o. female with past medical history reviewed below presents to the emergency department with nasal congestion, cough, fever, chills over the past 24 hours.  She is not having any chest pain or sore throat.  No sick contacts.  Denies abdominal pain, vomiting, diarrhea.  Denies dysuria, hesitancy, urgency.  She was treated for ear drainage on 8/4.  She completed antibiotic treatment when week ago and reports that those symptoms are much improved. No radiation of symptoms or modifying factors.    Past Medical History:  Diagnosis Date   Breast cancer ([place])    left mastectomy 1995   Personal history of chemotherapy    Personal history of radiation therapy     There are no problems to display for this patient.   Past Surgical History:  Procedure Laterality Date   MASTECTOMY Left 1995    Allergies Patient has no known allergies.  Family History  Problem Relation Age of Onset   Breast cancer Maternal Aunt     Social History Social History   Tobacco Use   Smoking status: Never   Smokeless tobacco: Never  Substance Use Topics   Alcohol use: Never   Drug use: Never    Review of Systems  Constitutional: No fever/chills Eyes: No visual changes. ENT: No sore throat. Positive congestion.  Cardiovascular: Denies chest pain. Respiratory: Denies shortness of breath. Positive cough.  Gastrointestinal: No abdominal pain.  No nausea, no vomiting.  No diarrhea.  No constipation. Genitourinary: Negative for dysuria. Musculoskeletal: Negative for back pain. Skin: Negative for rash. Neurological: Negative for headaches, focal weakness or numbness.  10-point ROS otherwise negative.  ____________________________________________   PHYSICAL EXAM:  VITAL SIGNS: ED Triage Vitals  Enc Vitals Group      BP 08/08/21 0838 (!) 158/87     Pulse Rate 08/08/21 0838 84     Resp 08/08/21 0838 18     Temp 08/08/21 0838 99.2 F (37.3 C)     Temp Source 08/08/21 0852 Oral     SpO2 08/08/21 0838 97 %     Weight 08/08/21 0846 145 lb (65.8 kg)     Height 08/08/21 0846 '5\' 4"'$  (1.626 m)   Constitutional: Alert and oriented. Well appearing and in no acute distress. Eyes: Conjunctivae are normal.  Head: Atraumatic. Ears:  Healthy appearing ear canals and TMs bilaterally Nose: Mild congestion/rhinnorhea. Mouth/Throat: Mucous membranes are moist.  Oropharynx non-erythematous. No PTA. Widely patent oropharynx.  Neck: No stridor.   Cardiovascular: Normal rate, regular rhythm. Good peripheral circulation. Grossly normal heart sounds.   Respiratory: Normal respiratory effort.  No retractions. Lungs CTAB. Gastrointestinal: Soft and nontender. No distention.  Musculoskeletal: No lower extremity tenderness nor edema. No gross deformities of extremities. Neurologic:  Normal speech and language. No gross focal neurologic deficits are appreciated.  Skin:  Skin is warm, dry and intact. No rash noted.  ____________________________________________   LABS (all labs ordered are listed, but only abnormal results are displayed)  Labs Reviewed  RESP PANEL BY RT-PCR (FLU A&B, COVID) ARPGX2 - Abnormal; Notable for the following components:      Result Value   SARS Coronavirus 2 by RT PCR POSITIVE (*)    All other components within normal limits  BASIC METABOLIC PANEL   ____________________________________________  RADIOLOGY  DG Chest Portable 1 View  Result Date: 08/08/2021 CLINICAL DATA:  Cough, drainage, congestion and subjective fever. EXAM: PORTABLE CHEST 1 VIEW COMPARISON:  None. FINDINGS: Heart size is within normal limits. Lungs are clear. No pleural effusion or pneumothorax is seen. Osseous structures about the chest are unremarkable. Surgical clips overlying the LEFT chest wall, compatible with history of  LEFT mastectomy in 1985. IMPRESSION: No active disease. No evidence of pneumonia or pulmonary edema. Electronically Signed   By: Franki Cabot M.D.   On: 08/08/2021 09:47    ____________________________________________   PROCEDURES  Procedure(s) performed:   Procedures  None  ____________________________________________   INITIAL IMPRESSION / ASSESSMENT AND PLAN / ED COURSE  Pertinent labs & imaging results that were available during my care of the patient were reviewed by me and considered in my medical decision making (see chart for details).   Patient presents to the emergency department with flulike symptoms over the past 24 hours.  No clear source of infection on ENT exam other than some mild rhinorrhea.  No peritonsillar abscess, tonsillar hypertrophy, exudate.  Clinically have some suspicion for flu versus COVID.  Will obtain chest x-ray to evaluate for community-acquired pneumonia. Viral PCR testing sent. Vitals not consistent with SIRS. Patient look very well and comfortable. No hypoxemia.   Renal function is WNL. GFR > 60. Patient is a candidate for Paxlovid. Discussed treatment options and quarantine recommendation. Discussed results and treatment plan with daughter by phone per patient request.   Sandra Herrera was evaluated in Emergency Department on 08/08/2021 for the symptoms described in the history of present illness. She was evaluated in the context of the global COVID-19 pandemic, which necessitated consideration that the patient might be at risk for infection with the SARS-CoV-2 virus that causes COVID-19. Institutional protocols and algorithms that pertain to the evaluation of patients at risk for COVID-19 are in a state of rapid change based on information released by regulatory bodies including the CDC and federal and state organizations. These policies and algorithms were followed during the patient's care in the  ED.  ____________________________________________  FINAL CLINICAL IMPRESSION(S) / ED DIAGNOSES  Final diagnoses:  COVID-19    NEW OUTPATIENT MEDICATIONS STARTED DURING THIS VISIT:  New Prescriptions   NIRMATRELVIR/RITONAVIR EUA (PAXLOVID) TABS    Take 3 tablets by mouth 2 (two) times daily for 5 days. Patient GFR is 60. Take nirmatrelvir (150 mg) two tablets twice daily for 5 days and ritonavir (100 mg) one tablet twice daily for 5 days.    Note:  This document was prepared using Dragon voice recognition software and may include unintentional dictation errors.  Nanda Quinton, MD, [facility] Emergency Medicine    Ashante Snelling, [facility], MD 08/08/21 1130

## 2021-08-08 NOTE — ED Triage Notes (Signed)
Pt arrives pov with driver, c/o cough, nasal drainage, congestion, and subjective fever. Pt denies otc meds pta.

## 2021-08-08 NOTE — Discharge Instructions (Addendum)
You were seen in the emergency department today with [place].  I am starting you on antiviral medications.  Please take as directed.  Remain in quarantine.  I have attached some information about managing her symptoms at home.  Should you develop trouble breathing, chest pain, severe fatigue, other severe symptoms please return to the emergency department.

## 2021-08-09 ENCOUNTER — Telehealth (HOSPITAL_BASED_OUTPATIENT_CLINIC_OR_DEPARTMENT_OTHER): Payer: Self-pay | Admitting: Emergency Medicine

## 2021-08-10 DIAGNOSIS — J029 Acute pharyngitis, unspecified: Secondary | ICD-10-CM | POA: Diagnosis not present

## 2021-08-10 DIAGNOSIS — U071 COVID-19: Secondary | ICD-10-CM | POA: Diagnosis not present

## 2021-08-26 DIAGNOSIS — L989 Disorder of the skin and subcutaneous tissue, unspecified: Secondary | ICD-10-CM | POA: Diagnosis not present

## 2021-08-26 DIAGNOSIS — H6503 Acute serous otitis media, bilateral: Secondary | ICD-10-CM | POA: Diagnosis not present

## 2021-08-26 DIAGNOSIS — U071 COVID-19: Secondary | ICD-10-CM | POA: Diagnosis not present

## 2021-08-26 DIAGNOSIS — G933 Postviral fatigue syndrome: Secondary | ICD-10-CM | POA: Diagnosis not present

## 2021-09-08 DIAGNOSIS — B078 Other viral warts: Secondary | ICD-10-CM | POA: Diagnosis not present

## 2021-09-08 DIAGNOSIS — D239 Other benign neoplasm of skin, unspecified: Secondary | ICD-10-CM | POA: Diagnosis not present

## 2021-09-08 DIAGNOSIS — Z853 Personal history of malignant neoplasm of breast: Secondary | ICD-10-CM | POA: Diagnosis not present

## 2021-09-08 DIAGNOSIS — E785 Hyperlipidemia, unspecified: Secondary | ICD-10-CM | POA: Diagnosis not present

## 2021-09-08 DIAGNOSIS — L818 Other specified disorders of pigmentation: Secondary | ICD-10-CM | POA: Diagnosis not present

## 2021-09-08 DIAGNOSIS — D489 Neoplasm of uncertain behavior, unspecified: Secondary | ICD-10-CM | POA: Diagnosis not present

## 2021-09-08 DIAGNOSIS — I10 Essential (primary) hypertension: Secondary | ICD-10-CM | POA: Diagnosis not present

## 2021-09-08 DIAGNOSIS — L821 Other seborrheic keratosis: Secondary | ICD-10-CM | POA: Diagnosis not present

## 2021-11-23 ENCOUNTER — Encounter (HOSPITAL_BASED_OUTPATIENT_CLINIC_OR_DEPARTMENT_OTHER): Payer: Self-pay | Admitting: Emergency Medicine

## 2021-11-23 ENCOUNTER — Other Ambulatory Visit: Payer: Self-pay

## 2021-11-23 ENCOUNTER — Emergency Department (HOSPITAL_BASED_OUTPATIENT_CLINIC_OR_DEPARTMENT_OTHER)
Admission: EM | Admit: 2021-11-23 | Discharge: 2021-11-23 | Disposition: A | Discharge status: Home / Self Care | Payer: Medicare Other | Attending: Emergency Medicine | Admitting: Emergency Medicine

## 2021-11-23 ENCOUNTER — Emergency Department (HOSPITAL_BASED_OUTPATIENT_CLINIC_OR_DEPARTMENT_OTHER): Payer: Medicare Other

## 2021-11-23 DIAGNOSIS — Z20822 Contact with and (suspected) exposure to covid-19: Secondary | ICD-10-CM | POA: Insufficient documentation

## 2021-11-23 DIAGNOSIS — Z853 Personal history of malignant neoplasm of breast: Secondary | ICD-10-CM | POA: Diagnosis not present

## 2021-11-23 DIAGNOSIS — R531 Weakness: Secondary | ICD-10-CM | POA: Diagnosis not present

## 2021-11-23 DIAGNOSIS — Z8616 Personal history of COVID-19: Secondary | ICD-10-CM | POA: Diagnosis not present

## 2021-11-23 DIAGNOSIS — R0789 Other chest pain: Secondary | ICD-10-CM | POA: Insufficient documentation

## 2021-11-23 DIAGNOSIS — R0602 Shortness of breath: Secondary | ICD-10-CM | POA: Insufficient documentation

## 2021-11-23 DIAGNOSIS — R42 Dizziness and giddiness: Secondary | ICD-10-CM | POA: Insufficient documentation

## 2021-11-23 HISTORY — DX: Pure hypercholesterolemia, unspecified: E78.00

## 2021-11-23 LAB — CBC
HCT: 42.4 % (ref 36.0–46.0)
Hemoglobin: 13.7 g/dL (ref 12.0–15.0)
MCH: 27.8 pg (ref 26.0–34.0)
MCHC: 32.3 g/dL (ref 30.0–36.0)
MCV: 86 fL (ref 80.0–100.0)
Platelets: 247 10*3/uL (ref 150–400)
RBC: 4.93 MIL/uL (ref 3.87–5.11)
RDW: 14.5 % (ref 11.5–15.5)
WBC: 5.2 10*3/uL (ref 4.0–10.5)
nRBC: 0 % (ref 0.0–0.2)

## 2021-11-23 LAB — TROPONIN I (HIGH SENSITIVITY)
Troponin I (High Sensitivity): 8 ng/L (ref <18)
Troponin I (High Sensitivity): 8 ng/L (ref <18)

## 2021-11-23 LAB — URINALYSIS, ROUTINE W REFLEX MICROSCOPIC
Bilirubin Urine: NEGATIVE
Glucose, UA: NEGATIVE mg/dL
Hgb urine dipstick: NEGATIVE
Ketones, ur: NEGATIVE mg/dL
Nitrite: NEGATIVE
Protein, ur: NEGATIVE mg/dL
Specific Gravity, Urine: 1.015 (ref 1.005–1.030)
pH: 6.5 (ref 5.0–8.0)

## 2021-11-23 LAB — COMPREHENSIVE METABOLIC PANEL
ALT: 19 U/L (ref 0–44)
AST: 30 U/L (ref 15–41)
Albumin: 4.7 g/dL (ref 3.5–5.0)
Alkaline Phosphatase: 59 U/L (ref 38–126)
Anion gap: 10 (ref 5–15)
BUN: 10 mg/dL (ref 8–23)
CO2: 27 mmol/L (ref 22–32)
Calcium: 11 mg/dL — ABNORMAL HIGH (ref 8.9–10.3)
Chloride: 105 mmol/L (ref 98–111)
Creatinine, Ser: 0.77 mg/dL (ref 0.44–1.00)
GFR, Estimated: >60 mL/min (ref >60)
Glucose, Bld: 96 mg/dL (ref 70–99)
Potassium: 3.9 mmol/L (ref 3.5–5.1)
Sodium: 142 mmol/L (ref 135–145)
Total Bilirubin: 0.5 mg/dL (ref 0.3–1.2)
Total Protein: 8.1 g/dL (ref 6.5–8.1)

## 2021-11-23 LAB — RESP PANEL BY RT-PCR (FLU A&B, COVID) ARPGX2
Influenza A by PCR: NEGATIVE
Influenza B by PCR: NEGATIVE
SARS Coronavirus 2 by RT PCR: NEGATIVE

## 2021-11-23 LAB — MAGNESIUM: Magnesium: 2 mg/dL (ref 1.7–2.4)

## 2021-11-23 LAB — URINALYSIS, MICROSCOPIC (REFLEX)

## 2021-11-23 LAB — BRAIN NATRIURETIC PEPTIDE: B Natriuretic Peptide: 161.1 pg/mL — ABNORMAL HIGH (ref 0.0–100.0)

## 2021-11-23 MED ORDER — ACETAMINOPHEN 325 MG PO TABS
650.0000 mg | ORAL_TABLET | Freq: Once | ORAL | Status: AC
  Administered 2021-11-23: 11:00:00 650 mg via ORAL
  Filled 2021-11-23: qty 2

## 2021-11-23 MED ORDER — IOHEXOL 350 MG/ML SOLN
100.0000 mL | Freq: Once | INTRAVENOUS | Status: AC | PRN
  Administered 2021-11-23: 12:00:00 75 mL via INTRAVENOUS

## 2021-11-23 NOTE — ED Triage Notes (Addendum)
Pt states she went for a flu shot on Friday.  Sunday she started to have chills and weakness.  She continues to have weakness and some lightheadedness today but has improved a little.  Pt eating and drinking ok.  No pain or sob.  Pt denies dysuria.

## 2021-11-23 NOTE — ED Provider Notes (Addendum)
[place] EMERGENCY DEPARTMENT Provider Note   CSN: [id number] Arrival date & time: 11/23/21  0841     History Chief Complaint  Patient presents with   Weakness    Sandra Herrera is a 75 y.o. female.   Weakness Associated symptoms: dizziness   Associated symptoms: no abdominal pain, no arthralgias, no chest pain, no cough, no diarrhea, no dysuria, no fever, no headaches, no myalgias, no nausea, no seizures, no shortness of breath and no vomiting   Patient presents for chills and generalized weakness.  Over the holiday, she was in close contact to her grandchild who did have the flu.  She got a flu shot on Friday.  Starting Friday evening, she has had symptoms of generalized weakness.  She also endorses intermittent chills.  She denies any known fevers.  She states that she has had a normal p.o. intake.  She has not had any vomiting or diarrhea.  She denies any chest discomfort or shortness of breath.  Patient has been ambulatory but does notice symptoms of weakness when she does walk.  She states that, at baseline, she is quite active.  She does have a remote history of breast cancer (over 20 years ago).    Past Medical History:  Diagnosis Date   Breast cancer ([place])    left mastectomy 1995   Hypercholesteremia    Personal history of chemotherapy    Personal history of radiation therapy     There are no problems to display for this patient.   Past Surgical History:  Procedure Laterality Date   MASTECTOMY Left 1995     OB History   No obstetric history on file.     Family History  Problem Relation Age of Onset   Breast cancer Maternal Aunt     Social History   Tobacco Use   Smoking status: Never   Smokeless tobacco: Never  Substance Use Topics   Alcohol use: Never   Drug use: Never    Home Medications Prior to Admission medications   Medication Sig Start Date End Date Taking? Authorizing Provider  chlorhexidine (PERIDEX) 0.12 %  solution 15 mLs 2 (two) times daily. 01/19/21   [provider]  ezetimibe (ZETIA) 10 MG tablet 1 tablet 11/18/15   [provider]  fluticasone (FLONASE) 50 MCG/ACT nasal spray Place 2 sprays into both nostrils daily for 7 days. 03/03/21 07/30/21  Dorie Rank, MD  hydrochlorothiazide (HYDRODIURIL) 12.5 MG tablet 1 tablet    [provider]  ibuprofen (ADVIL,MOTRIN) 600 MG tablet  10/13/16   [provider]  loratadine (CLARITIN) 10 MG tablet Take 1 tablet (10 mg total) by mouth daily. 03/03/21   Dorie Rank, MD  meclizine (ANTIVERT) 25 MG tablet 1 tablet as needed    [provider]  methylPREDNISolone (MEDROL DOSEPAK) 4 MG TBPK tablet follow package directions 10/10/16   [provider]  pseudoephedrine (SUDAFED 12 HOUR) 120 MG 12 hr tablet Take 1 tablet (120 mg total) by mouth every 12 (twelve) hours. 03/03/21   Dorie Rank, MD  VITAMIN D, CHOLECALCIFEROL, PO Take by mouth.    [provider]    Allergies    Levonorgestrel-ethinyl estrad, Alendronate sodium, Ibandronic acid, Pravastatin sodium, and Rosuvastatin  Review of Systems   Review of Systems  Constitutional:  Positive for activity change, chills and fatigue. Negative for appetite change and fever.  HENT:  Negative for congestion, ear pain, facial swelling, rhinorrhea, sore throat and trouble swallowing.   Eyes:  Negative for pain and visual disturbance.  Respiratory:  Negative for cough, chest tightness and shortness of breath.        Orthopnea  Cardiovascular:  Negative for chest pain and palpitations.  Gastrointestinal:  Negative for abdominal pain, diarrhea, nausea and vomiting.  Genitourinary:  Negative for dysuria, flank pain, hematuria and pelvic pain.  Musculoskeletal:  Negative for arthralgias, back pain, joint swelling, myalgias and neck pain.  Skin:  Negative for color change and rash.  Neurological:  Positive for dizziness, weakness (generalized) and light-headedness.  Negative for seizures, syncope, numbness and headaches.  Hematological:  Does not bruise/bleed easily.  Psychiatric/Behavioral:  Negative for confusion and decreased concentration.   All other systems reviewed and are negative.  Physical Exam Updated Vital Signs BP (!) 148/90   Pulse 78   Temp 98.5 F (36.9 C)   Resp 20   Ht 5\' 4"  (1.626 m)   Wt 66.7 kg   SpO2 100%   BMI 25.23 kg/m   Physical Exam Vitals and nursing note reviewed.  Constitutional:      General: She is not in acute distress.    Appearance: Normal appearance. She is well-developed and normal weight. She is not ill-appearing, toxic-appearing or diaphoretic.  HENT:     Head: Normocephalic and atraumatic.     Right Ear: External ear normal.     Left Ear: External ear normal.     Nose: Nose normal. No congestion or rhinorrhea.     Mouth/Throat:     Mouth: Mucous membranes are moist.     Pharynx: Oropharynx is clear. No oropharyngeal exudate.  Eyes:     General: No scleral icterus.    Extraocular Movements: Extraocular movements intact.     Conjunctiva/sclera: Conjunctivae normal.  Cardiovascular:     Rate and Rhythm: Normal rate and regular rhythm.     Heart sounds: No murmur heard. Pulmonary:     Effort: Pulmonary effort is normal. No respiratory distress.     Breath sounds: Normal breath sounds. No wheezing or rales.     Comments: Bony swelling anterior chest Chest:     Chest wall: No tenderness.  Abdominal:     General: Abdomen is flat.     Palpations: Abdomen is soft.     Tenderness: There is no abdominal tenderness.  Musculoskeletal:        General: No swelling or tenderness. Normal range of motion.     Cervical back: Normal range of motion and neck supple. No rigidity.     Right lower leg: No edema.     Left lower leg: No edema.  Skin:    General: Skin is warm and dry.     Capillary Refill: Capillary refill takes less than 2 seconds.     Coloration: Skin is not jaundiced or pale.   Neurological:     General: No focal deficit present.     Mental Status: She is alert and oriented to person, place, and time.     Cranial Nerves: No cranial nerve deficit.     Sensory: No sensory deficit.     Motor: No weakness.  Psychiatric:        Mood and Affect: Mood normal.        Behavior: Behavior normal.        Thought Content: Thought content normal.        Judgment: Judgment normal.    ED Results / Procedures / Treatments   Labs (all labs ordered are listed, but only abnormal  results are displayed) Labs Reviewed  COMPREHENSIVE METABOLIC PANEL - Abnormal; Notable for the following components:      Result Value   Calcium 11.0 (*)    All other components within normal limits  BRAIN NATRIURETIC PEPTIDE - Abnormal; Notable for the following components:   B Natriuretic Peptide 161.1 (*)    All other components within normal limits  URINALYSIS, ROUTINE W REFLEX MICROSCOPIC - Abnormal; Notable for the following components:   Leukocytes,Ua SMALL (*)    All other components within normal limits  URINALYSIS, MICROSCOPIC (REFLEX) - Abnormal; Notable for the following components:   Bacteria, UA RARE (*)    All other components within normal limits  RESP PANEL BY RT-PCR (FLU A&B, COVID) ARPGX2  CBC  MAGNESIUM  TROPONIN I (HIGH SENSITIVITY)  TROPONIN I (HIGH SENSITIVITY)    EKG None  Radiology CT Angio Chest PE W and/or Wo Contrast  Result Date: 11/23/2021 CLINICAL DATA:  Pulmonary emboli suspected, high probability. Chills and weakness following influenza vaccine. EXAM: CT ANGIOGRAPHY CHEST WITH CONTRAST TECHNIQUE: Multidetector CT imaging of the chest was performed using the standard protocol during bolus administration of intravenous contrast. Multiplanar CT image reconstructions and MIPs were obtained to evaluate the vascular anatomy. CONTRAST:  68mL OMNIPAQUE IOHEXOL 350 MG/ML SOLN COMPARISON:  Radiography 08/08/2021 FINDINGS: Cardiovascular: Heart size is normal.  Coronary artery calcification is present. Aortic atherosclerotic calcification is present. Sitting aorta shows a diameter of 4.4 cm. Pulmonary arterial opacification is good. There are no pulmonary emboli. Mediastinum/Nodes: No hilar mass or adenopathy. Lungs/Pleura: Mild chronic scarring of the anterior left lung related to radiation. No evidence of pneumonia or active collapse. No pulmonary mass. No pleural effusion. Upper Abdomen: Normal Musculoskeletal: No significant spinal or rib finding. Previous left mastectomy. Review of the MIP images confirms the above findings. IMPRESSION: No pulmonary emboli or other acute chest pathology. Previous mastectomy on the left. 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 Aortic Atherosclerosis (ICD10-I70.0). Coronary artery calcification. 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 Recommend annual imaging followup by CTA or MRA. This recommendation follows 2010 ACCF/AHA/AATS/ACR/ASA/SCA/SCAI/SIR/STS/SVM Guidelines for the Diagnosis and Management of Patients with Thoracic Aortic Disease. Circulation. 2010; 121: X106-Y694. Aortic aneurysm NOS (ICD10-I71.9) Electronically Signed   By: Nelson Chimes M.D.   On: 11/23/2021 12:26    Procedures Procedures   Medications Ordered in ED Medications  acetaminophen (TYLENOL) tablet 650 mg (650 mg Oral Given 11/23/21 1052)  iohexol (OMNIPAQUE) 350 MG/ML injection 100 mL (75 mLs Intravenous Contrast Given 11/23/21 1150)    ED Course  I have reviewed the triage vital signs and the nursing notes.  Pertinent labs & imaging results that were available during my care of the patient were reviewed by me and considered in my medical decision making (see chart for details).    MDM Rules/Calculators/A&P                          Patient presents for chills and generalized weakness.  She denies any other associated symptoms.  She has had recent sick contacts with family members over the holiday.  She also  underwent influenza vaccine on Friday.  On arrival in the ED, vital signs are normal.  SPO2 is normal on room air.  Breathing is even and unlabored.  She has no areas of discomfort or tenderness.  Lungs are clear to auscultation.  She has no focal neurologic deficits.  Patient's diagnostic work-up was notable only for  BNP of 161.  No prior values are available for comparison.  Work-up was otherwise reassuring.  On bedside echocardiogram, patient does have grossly normal heart function.  No pericardial effusion is identified.  On inspection of IVC, IVC is fully flattened.  I do suspect patient has some dehydration.  Patient was agreeable to p.o. hydration at home.  I think she will benefit from close PCP follow-up as well.  She was able to ambulate with steady gait, unassisted.  Return precautions were given.  Patient was discharged in stable condition.  Final Clinical Impression(s) / ED Diagnoses Final diagnoses:  Generalized weakness    Rx / DC Orders ED Discharge Orders     None        Godfrey Pick, MD 11/24/21 4840    Godfrey Pick, MD 11/24/21 [phone]

## 2021-11-24 DIAGNOSIS — F5101 Primary insomnia: Secondary | ICD-10-CM | POA: Diagnosis not present

## 2021-11-24 DIAGNOSIS — I10 Essential (primary) hypertension: Secondary | ICD-10-CM | POA: Diagnosis not present

## 2021-11-24 DIAGNOSIS — R5383 Other fatigue: Secondary | ICD-10-CM | POA: Diagnosis not present

## 2022-01-19 DIAGNOSIS — F5101 Primary insomnia: Secondary | ICD-10-CM | POA: Diagnosis not present

## 2022-01-19 DIAGNOSIS — Z23 Encounter for immunization: Secondary | ICD-10-CM | POA: Diagnosis not present

## 2022-01-19 DIAGNOSIS — Z8616 Personal history of COVID-19: Secondary | ICD-10-CM | POA: Diagnosis not present

## 2022-01-19 DIAGNOSIS — E785 Hyperlipidemia, unspecified: Secondary | ICD-10-CM | POA: Diagnosis not present

## 2022-01-19 DIAGNOSIS — I10 Essential (primary) hypertension: Secondary | ICD-10-CM | POA: Diagnosis not present

## 2022-03-26 DIAGNOSIS — I10 Essential (primary) hypertension: Secondary | ICD-10-CM | POA: Diagnosis not present

## 2022-03-26 DIAGNOSIS — E785 Hyperlipidemia, unspecified: Secondary | ICD-10-CM | POA: Diagnosis not present

## 2022-05-11 ENCOUNTER — Other Ambulatory Visit: Payer: Self-pay | Admitting: Family Medicine

## 2022-05-11 DIAGNOSIS — Z1231 Encounter for screening mammogram for malignant neoplasm of breast: Secondary | ICD-10-CM

## 2022-06-01 DIAGNOSIS — C50912 Malignant neoplasm of unspecified site of left female breast: Secondary | ICD-10-CM | POA: Diagnosis not present

## 2022-06-02 DIAGNOSIS — H1045 Other chronic allergic conjunctivitis: Secondary | ICD-10-CM | POA: Diagnosis not present

## 2022-06-02 DIAGNOSIS — H04123 Dry eye syndrome of bilateral lacrimal glands: Secondary | ICD-10-CM | POA: Diagnosis not present

## 2022-06-18 ENCOUNTER — Ambulatory Visit
Admission: RE | Admit: 2022-06-18 | Discharge: 2022-06-18 | Disposition: A | Discharge status: Home / Self Care | Payer: Medicare Other | Source: Ambulatory Visit | Attending: Family Medicine | Admitting: Family Medicine

## 2022-06-18 DIAGNOSIS — Z1231 Encounter for screening mammogram for malignant neoplasm of breast: Secondary | ICD-10-CM | POA: Diagnosis not present

## 2022-07-20 DIAGNOSIS — I10 Essential (primary) hypertension: Secondary | ICD-10-CM | POA: Diagnosis not present

## 2022-07-20 DIAGNOSIS — G720 Drug-induced myopathy: Secondary | ICD-10-CM | POA: Diagnosis not present

## 2022-07-20 DIAGNOSIS — Z0000 Encounter for general adult medical examination without abnormal findings: Secondary | ICD-10-CM | POA: Diagnosis not present

## 2022-07-20 DIAGNOSIS — K5900 Constipation, unspecified: Secondary | ICD-10-CM | POA: Diagnosis not present

## 2022-07-20 DIAGNOSIS — E785 Hyperlipidemia, unspecified: Secondary | ICD-10-CM | POA: Diagnosis not present

## 2022-07-20 DIAGNOSIS — H8110 Benign paroxysmal vertigo, unspecified ear: Secondary | ICD-10-CM | POA: Diagnosis not present

## 2022-07-20 DIAGNOSIS — J301 Allergic rhinitis due to pollen: Secondary | ICD-10-CM | POA: Diagnosis not present

## 2022-07-20 DIAGNOSIS — F5101 Primary insomnia: Secondary | ICD-10-CM | POA: Diagnosis not present

## 2022-07-20 DIAGNOSIS — Z8619 Personal history of other infectious and parasitic diseases: Secondary | ICD-10-CM | POA: Diagnosis not present

## 2022-07-20 DIAGNOSIS — Z9012 Acquired absence of left breast and nipple: Secondary | ICD-10-CM | POA: Diagnosis not present

## 2023-01-06 DIAGNOSIS — C50912 Malignant neoplasm of unspecified site of left female breast: Secondary | ICD-10-CM | POA: Diagnosis not present

## 2023-01-20 DIAGNOSIS — H6991 Unspecified Eustachian tube disorder, right ear: Secondary | ICD-10-CM | POA: Diagnosis not present

## 2023-01-20 DIAGNOSIS — I10 Essential (primary) hypertension: Secondary | ICD-10-CM | POA: Diagnosis not present

## 2023-01-20 DIAGNOSIS — L299 Pruritus, unspecified: Secondary | ICD-10-CM | POA: Diagnosis not present

## 2023-01-20 DIAGNOSIS — E785 Hyperlipidemia, unspecified: Secondary | ICD-10-CM | POA: Diagnosis not present

## 2023-01-20 DIAGNOSIS — R42 Dizziness and giddiness: Secondary | ICD-10-CM | POA: Diagnosis not present

## 2023-01-21 DIAGNOSIS — C50912 Malignant neoplasm of unspecified site of left female breast: Secondary | ICD-10-CM | POA: Diagnosis not present

## 2023-01-25 DIAGNOSIS — C50912 Malignant neoplasm of unspecified site of left female breast: Secondary | ICD-10-CM | POA: Diagnosis not present

## 2023-01-31 DIAGNOSIS — R509 Fever, unspecified: Secondary | ICD-10-CM | POA: Diagnosis not present

## 2023-01-31 DIAGNOSIS — U071 COVID-19: Secondary | ICD-10-CM | POA: Diagnosis not present

## 2023-02-02 DIAGNOSIS — J069 Acute upper respiratory infection, unspecified: Secondary | ICD-10-CM | POA: Diagnosis not present

## 2023-02-02 DIAGNOSIS — U071 COVID-19: Secondary | ICD-10-CM | POA: Diagnosis not present

## 2023-02-25 DIAGNOSIS — F5101 Primary insomnia: Secondary | ICD-10-CM | POA: Diagnosis not present

## 2023-02-25 DIAGNOSIS — I10 Essential (primary) hypertension: Secondary | ICD-10-CM | POA: Diagnosis not present

## 2023-02-25 DIAGNOSIS — H6991 Unspecified Eustachian tube disorder, right ear: Secondary | ICD-10-CM | POA: Diagnosis not present

## 2023-02-25 DIAGNOSIS — R058 Other specified cough: Secondary | ICD-10-CM | POA: Diagnosis not present

## 2023-02-25 DIAGNOSIS — R42 Dizziness and giddiness: Secondary | ICD-10-CM | POA: Diagnosis not present

## 2023-02-25 DIAGNOSIS — R202 Paresthesia of skin: Secondary | ICD-10-CM | POA: Diagnosis not present

## 2023-03-05 DIAGNOSIS — H6990 Unspecified Eustachian tube disorder, unspecified ear: Secondary | ICD-10-CM | POA: Diagnosis not present

## 2023-03-05 DIAGNOSIS — J3489 Other specified disorders of nose and nasal sinuses: Secondary | ICD-10-CM | POA: Diagnosis not present

## 2023-03-24 IMAGING — MG MM DIGITAL SCREENING UNILAT*R* W/ TOMO W/ CAD
6 series · 6 of 18 positions shown · non-contrast
Comparison: Previous exam(s).

CLINICAL DATA: Screening.

EXAM:
DIGITAL SCREENING UNILATERAL RIGHT MAMMOGRAM WITH CAD AND
TOMOSYNTHESIS
TECHNIQUE: Right screening digital craniocaudal and mediolateral oblique
mammograms were obtained. Right screening digital breast
tomosynthesis was performed. The images were evaluated with
computer-aided detection.

[R MLO synth-2D]
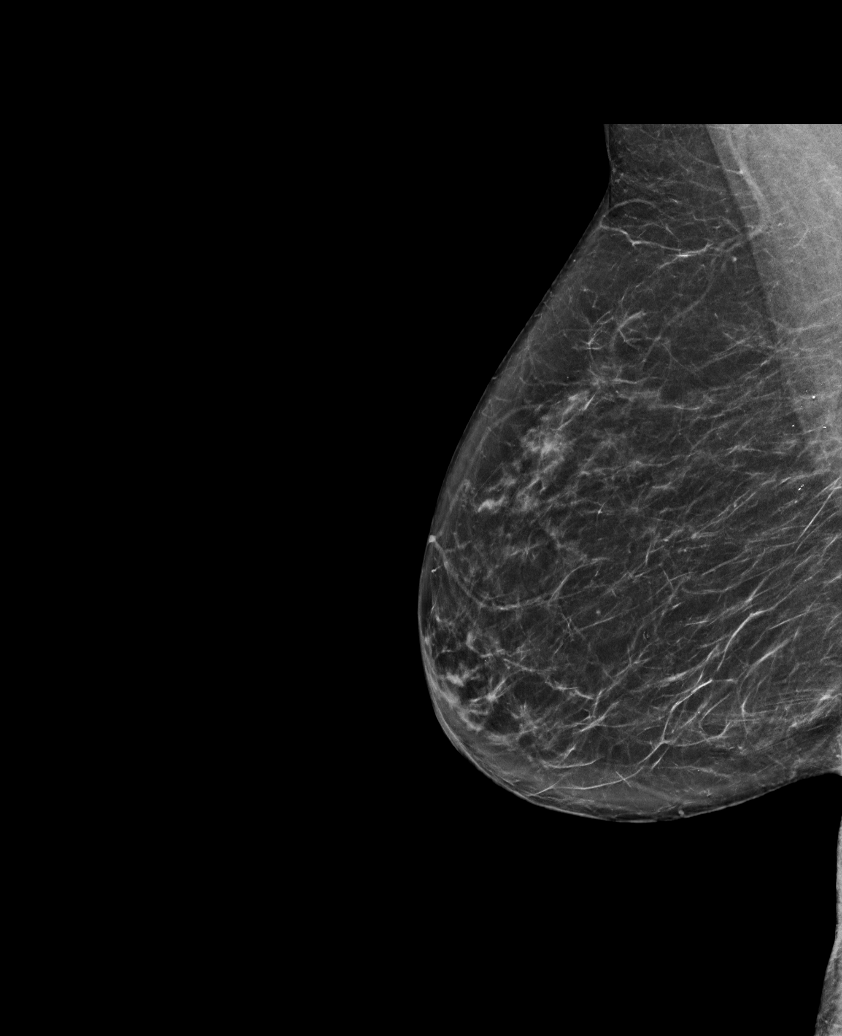

[R CC synth-2D (1 of 2)]
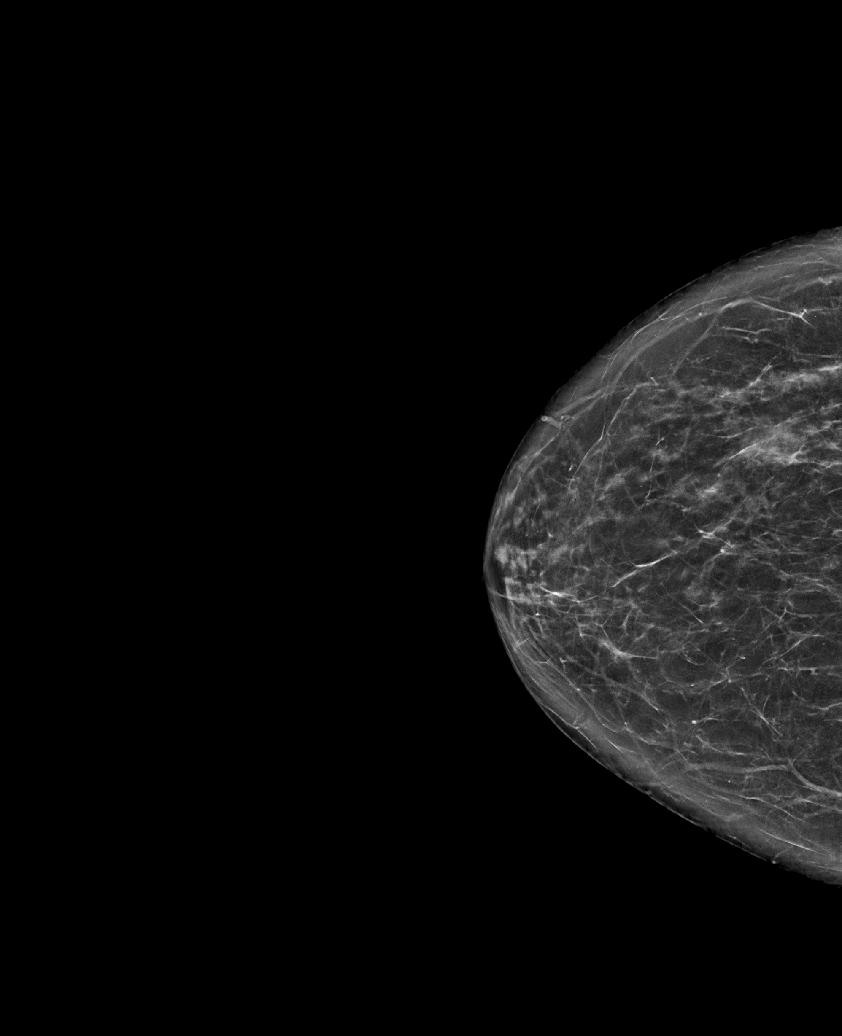

[R CC synth-2D (2 of 2)]
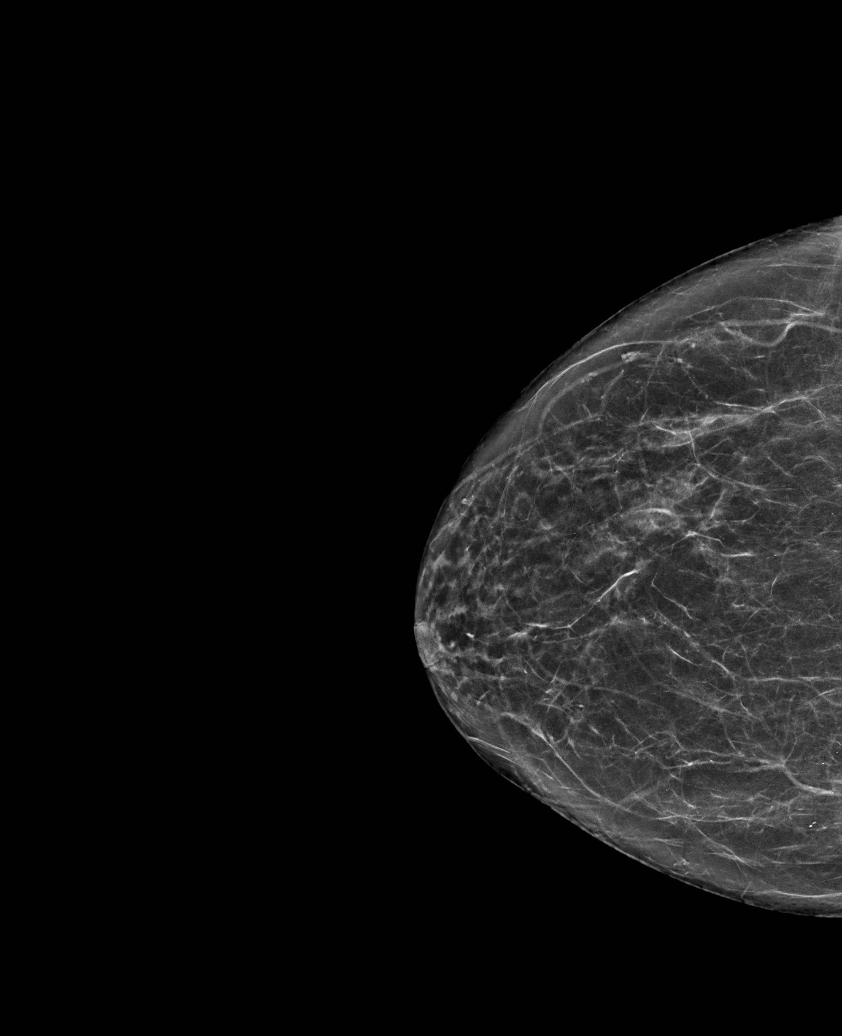

[R CC tomo (1 of 2) · tomo slice 37/72.0]
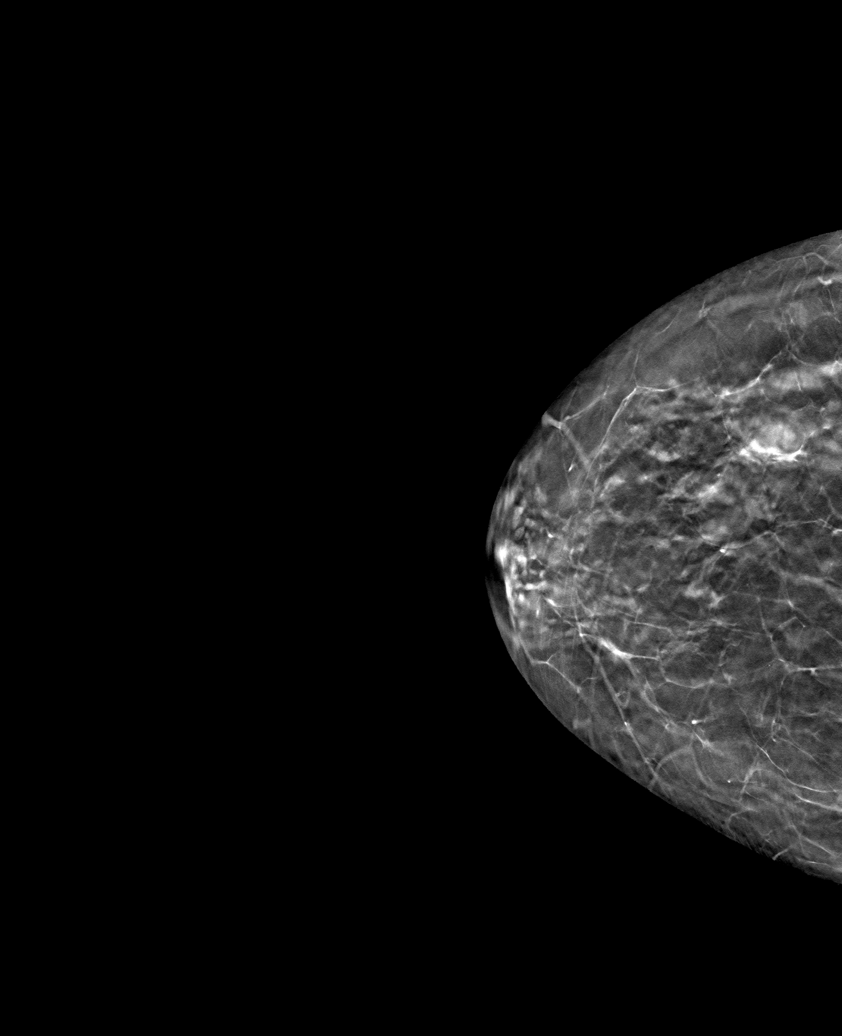

[R CC tomo (2 of 2) · tomo slice 34/67.0]
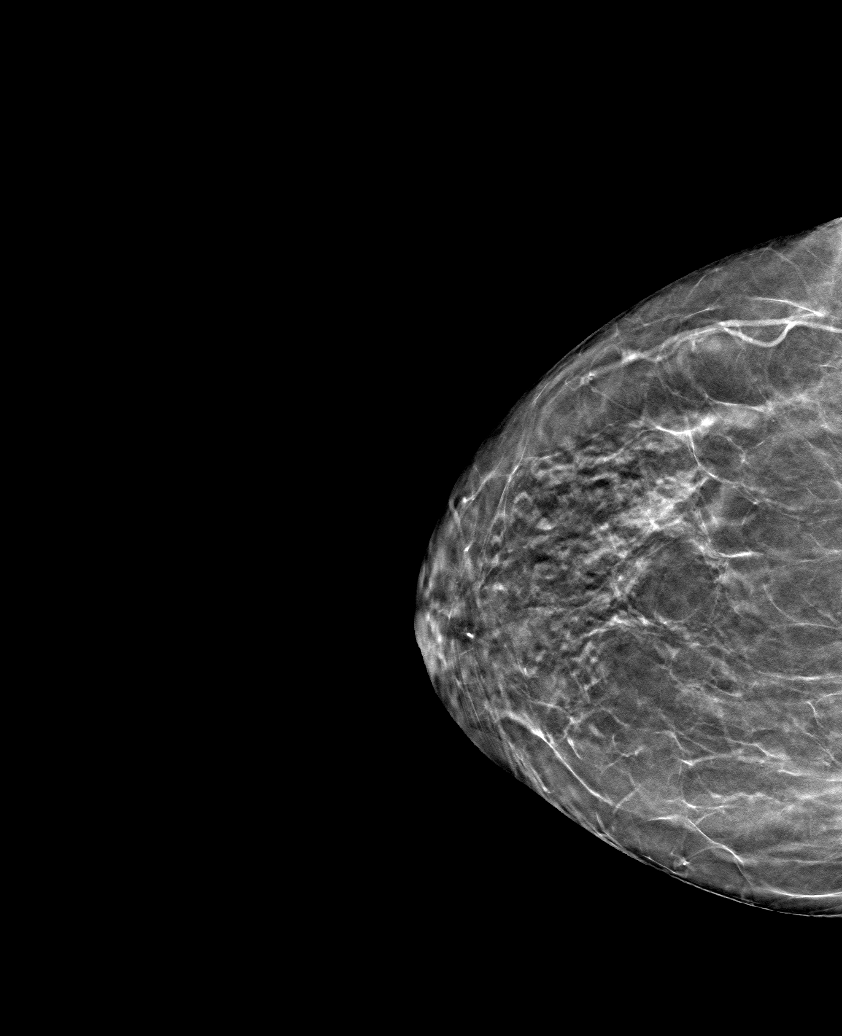

[R MLO tomo · tomo slice 37/72.0]
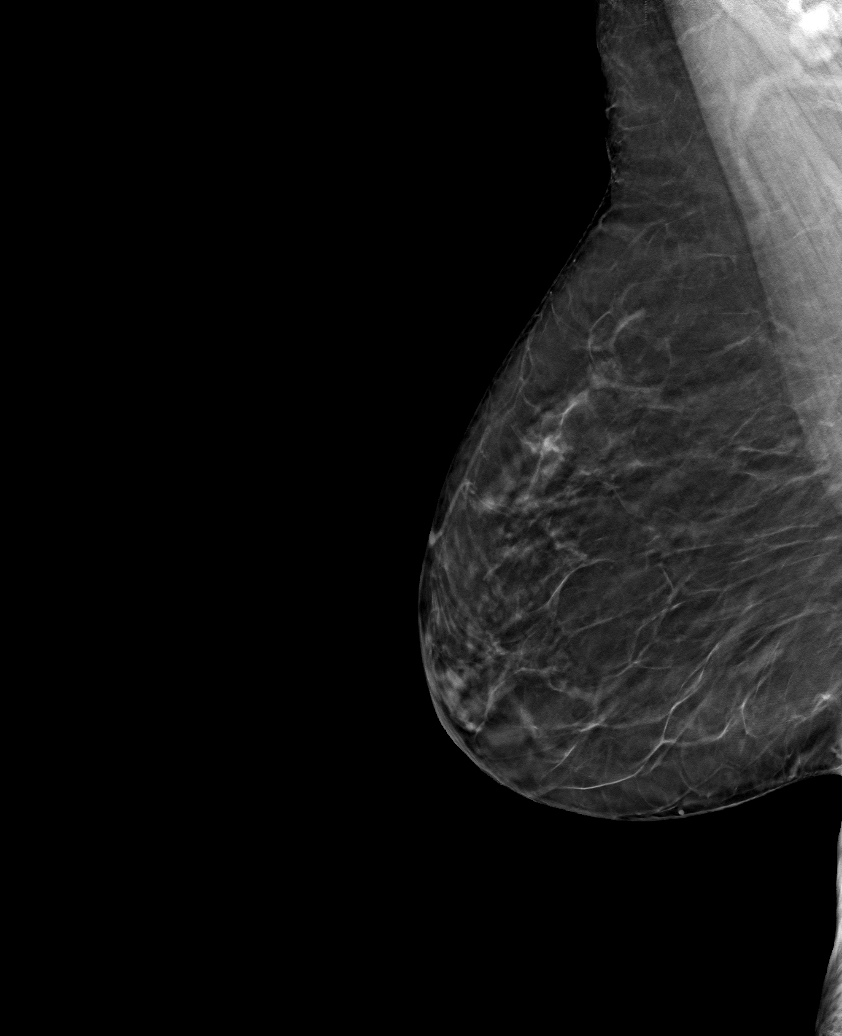

[6 of 18 positions shown; findings below may reference images not displayed]

ACR Breast Density Category c: The breast tissue is heterogeneously
dense, which may obscure small masses.
FINDINGS: The patient has had a left mastectomy. There are no findings
suspicious for malignancy.
IMPRESSION: No mammographic evidence of malignancy. A result letter of this
screening mammogram will be mailed directly to the patient.

RECOMMENDATION:
Screening mammogram in one year.  (Code:V6-Y-3H6)

BI-RADS CATEGORY  1: Negative.

## 2023-04-18 DIAGNOSIS — H9190 Unspecified hearing loss, unspecified ear: Secondary | ICD-10-CM | POA: Diagnosis not present

## 2023-04-18 DIAGNOSIS — H6993 Unspecified Eustachian tube disorder, bilateral: Secondary | ICD-10-CM | POA: Diagnosis not present

## 2023-04-18 DIAGNOSIS — H903 Sensorineural hearing loss, bilateral: Secondary | ICD-10-CM | POA: Diagnosis not present

## 2023-04-18 DIAGNOSIS — R42 Dizziness and giddiness: Secondary | ICD-10-CM | POA: Diagnosis not present

## 2023-04-29 DIAGNOSIS — F5101 Primary insomnia: Secondary | ICD-10-CM | POA: Diagnosis not present

## 2023-04-29 DIAGNOSIS — I10 Essential (primary) hypertension: Secondary | ICD-10-CM | POA: Diagnosis not present

## 2023-05-02 DIAGNOSIS — R42 Dizziness and giddiness: Secondary | ICD-10-CM | POA: Diagnosis not present

## 2023-05-09 DIAGNOSIS — R42 Dizziness and giddiness: Secondary | ICD-10-CM | POA: Diagnosis not present

## 2023-05-16 DIAGNOSIS — R42 Dizziness and giddiness: Secondary | ICD-10-CM | POA: Diagnosis not present

## 2023-06-07 ENCOUNTER — Other Ambulatory Visit: Payer: Self-pay | Admitting: Family Medicine

## 2023-06-07 DIAGNOSIS — Z1231 Encounter for screening mammogram for malignant neoplasm of breast: Secondary | ICD-10-CM

## 2023-06-20 DIAGNOSIS — Z961 Presence of intraocular lens: Secondary | ICD-10-CM | POA: Diagnosis not present

## 2023-06-20 DIAGNOSIS — H524 Presbyopia: Secondary | ICD-10-CM | POA: Diagnosis not present

## 2023-06-20 DIAGNOSIS — H43813 Vitreous degeneration, bilateral: Secondary | ICD-10-CM | POA: Diagnosis not present

## 2023-06-20 DIAGNOSIS — H5213 Myopia, bilateral: Secondary | ICD-10-CM | POA: Diagnosis not present

## 2023-06-20 DIAGNOSIS — H1045 Other chronic allergic conjunctivitis: Secondary | ICD-10-CM | POA: Diagnosis not present

## 2023-06-20 DIAGNOSIS — H16223 Keratoconjunctivitis sicca, not specified as Sjogren's, bilateral: Secondary | ICD-10-CM | POA: Diagnosis not present

## 2023-06-20 DIAGNOSIS — H52223 Regular astigmatism, bilateral: Secondary | ICD-10-CM | POA: Diagnosis not present

## 2023-06-29 ENCOUNTER — Ambulatory Visit
Admission: RE | Admit: 2023-06-29 | Discharge: 2023-06-29 | Disposition: A | Discharge status: Home / Self Care | Payer: Medicare Other | Source: Ambulatory Visit | Attending: Family Medicine | Admitting: Family Medicine

## 2023-06-29 DIAGNOSIS — Z1231 Encounter for screening mammogram for malignant neoplasm of breast: Secondary | ICD-10-CM | POA: Diagnosis not present

## 2023-08-12 DIAGNOSIS — R7303 Prediabetes: Secondary | ICD-10-CM | POA: Diagnosis not present

## 2023-08-12 DIAGNOSIS — I10 Essential (primary) hypertension: Secondary | ICD-10-CM | POA: Diagnosis not present

## 2023-08-12 DIAGNOSIS — Z0000 Encounter for general adult medical examination without abnormal findings: Secondary | ICD-10-CM | POA: Diagnosis not present

## 2023-08-12 DIAGNOSIS — K5900 Constipation, unspecified: Secondary | ICD-10-CM | POA: Diagnosis not present

## 2023-08-12 DIAGNOSIS — E785 Hyperlipidemia, unspecified: Secondary | ICD-10-CM | POA: Diagnosis not present

## 2023-08-12 DIAGNOSIS — J301 Allergic rhinitis due to pollen: Secondary | ICD-10-CM | POA: Diagnosis not present

## 2023-08-12 DIAGNOSIS — E559 Vitamin D deficiency, unspecified: Secondary | ICD-10-CM | POA: Diagnosis not present

## 2023-08-12 DIAGNOSIS — F5101 Primary insomnia: Secondary | ICD-10-CM | POA: Diagnosis not present

## 2023-08-12 DIAGNOSIS — Z8619 Personal history of other infectious and parasitic diseases: Secondary | ICD-10-CM | POA: Diagnosis not present

## 2023-08-12 DIAGNOSIS — M65311 Trigger thumb, right thumb: Secondary | ICD-10-CM | POA: Diagnosis not present

## 2023-08-12 DIAGNOSIS — G720 Drug-induced myopathy: Secondary | ICD-10-CM | POA: Diagnosis not present

## 2023-08-18 DIAGNOSIS — C50912 Malignant neoplasm of unspecified site of left female breast: Secondary | ICD-10-CM | POA: Diagnosis not present

## 2023-09-15 DIAGNOSIS — C50912 Malignant neoplasm of unspecified site of left female breast: Secondary | ICD-10-CM | POA: Diagnosis not present

## 2023-09-22 DIAGNOSIS — C50912 Malignant neoplasm of unspecified site of left female breast: Secondary | ICD-10-CM | POA: Diagnosis not present

## 2023-11-11 DIAGNOSIS — R682 Dry mouth, unspecified: Secondary | ICD-10-CM | POA: Diagnosis not present

## 2023-11-11 DIAGNOSIS — E785 Hyperlipidemia, unspecified: Secondary | ICD-10-CM | POA: Diagnosis not present

## 2023-11-11 DIAGNOSIS — F5101 Primary insomnia: Secondary | ICD-10-CM | POA: Diagnosis not present

## 2023-11-11 DIAGNOSIS — I10 Essential (primary) hypertension: Secondary | ICD-10-CM | POA: Diagnosis not present

## 2024-02-13 DIAGNOSIS — F5101 Primary insomnia: Secondary | ICD-10-CM | POA: Diagnosis not present

## 2024-02-13 DIAGNOSIS — G720 Drug-induced myopathy: Secondary | ICD-10-CM | POA: Diagnosis not present

## 2024-02-13 DIAGNOSIS — E785 Hyperlipidemia, unspecified: Secondary | ICD-10-CM | POA: Diagnosis not present

## 2024-02-13 DIAGNOSIS — I10 Essential (primary) hypertension: Secondary | ICD-10-CM | POA: Diagnosis not present

## 2024-02-21 DIAGNOSIS — C50912 Malignant neoplasm of unspecified site of left female breast: Secondary | ICD-10-CM | POA: Diagnosis not present

## 2024-03-17 DIAGNOSIS — I10 Essential (primary) hypertension: Secondary | ICD-10-CM | POA: Diagnosis not present

## 2024-03-17 DIAGNOSIS — J0100 Acute maxillary sinusitis, unspecified: Secondary | ICD-10-CM | POA: Diagnosis not present

## 2024-03-18 ENCOUNTER — Emergency Department (HOSPITAL_COMMUNITY)

## 2024-03-18 ENCOUNTER — Other Ambulatory Visit: Payer: Self-pay

## 2024-03-18 ENCOUNTER — Emergency Department (HOSPITAL_COMMUNITY): Admission: EM | Admit: 2024-03-18 | Discharge: 2024-03-18 | Disposition: A | Discharge status: Home / Self Care

## 2024-03-18 ENCOUNTER — Encounter (HOSPITAL_COMMUNITY): Payer: Self-pay

## 2024-03-18 DIAGNOSIS — Z79899 Other long term (current) drug therapy: Secondary | ICD-10-CM | POA: Insufficient documentation

## 2024-03-18 DIAGNOSIS — J019 Acute sinusitis, unspecified: Secondary | ICD-10-CM | POA: Diagnosis not present

## 2024-03-18 DIAGNOSIS — R030 Elevated blood-pressure reading, without diagnosis of hypertension: Secondary | ICD-10-CM | POA: Diagnosis not present

## 2024-03-18 DIAGNOSIS — I10 Essential (primary) hypertension: Secondary | ICD-10-CM | POA: Diagnosis not present

## 2024-03-18 DIAGNOSIS — R5383 Other fatigue: Secondary | ICD-10-CM | POA: Diagnosis not present

## 2024-03-18 DIAGNOSIS — I517 Cardiomegaly: Secondary | ICD-10-CM | POA: Diagnosis not present

## 2024-03-18 DIAGNOSIS — I4510 Unspecified right bundle-branch block: Secondary | ICD-10-CM | POA: Diagnosis not present

## 2024-03-18 DIAGNOSIS — J329 Chronic sinusitis, unspecified: Secondary | ICD-10-CM

## 2024-03-18 DIAGNOSIS — R9431 Abnormal electrocardiogram [ECG] [EKG]: Secondary | ICD-10-CM | POA: Diagnosis not present

## 2024-03-18 DIAGNOSIS — R0981 Nasal congestion: Secondary | ICD-10-CM | POA: Diagnosis not present

## 2024-03-18 DIAGNOSIS — R42 Dizziness and giddiness: Secondary | ICD-10-CM

## 2024-03-18 DIAGNOSIS — R0602 Shortness of breath: Secondary | ICD-10-CM | POA: Diagnosis not present

## 2024-03-18 LAB — CBC
HCT: 40.9 % (ref 36.0–46.0)
Hemoglobin: 13.3 g/dL (ref 12.0–15.0)
MCH: 28.1 pg (ref 26.0–34.0)
MCHC: 32.5 g/dL (ref 30.0–36.0)
MCV: 86.3 fL (ref 80.0–100.0)
Platelets: 217 10*3/uL (ref 150–400)
RBC: 4.74 MIL/uL (ref 3.87–5.11)
RDW: 14.6 % (ref 11.5–15.5)
WBC: 6.4 10*3/uL (ref 4.0–10.5)
nRBC: 0 % (ref 0.0–0.2)

## 2024-03-18 LAB — BASIC METABOLIC PANEL
Anion gap: 11 (ref 5–15)
BUN: 5 mg/dL — ABNORMAL LOW (ref 8–23)
CO2: 24 mmol/L (ref 22–32)
Calcium: 10.4 mg/dL — ABNORMAL HIGH (ref 8.9–10.3)
Chloride: 105 mmol/L (ref 98–111)
Creatinine, Ser: 0.7 mg/dL (ref 0.44–1.00)
GFR, Estimated: >60 mL/min (ref >60)
Glucose, Bld: 92 mg/dL (ref 70–99)
Potassium: 3.5 mmol/L (ref 3.5–5.1)
Sodium: 140 mmol/L (ref 135–145)

## 2024-03-18 LAB — TROPONIN I (HIGH SENSITIVITY): Troponin I (High Sensitivity): 9 ng/L (ref <18)

## 2024-03-18 MED ORDER — DOXYCYCLINE HYCLATE 100 MG PO TABS
100.0000 mg | ORAL_TABLET | Freq: Once | ORAL | Status: AC
  Administered 2024-03-18: 100 mg via ORAL
  Filled 2024-03-18: qty 1

## 2024-03-18 MED ORDER — LACTATED RINGERS IV BOLUS
1000.0000 mL | Freq: Once | INTRAVENOUS | Status: AC
  Administered 2024-03-18: 1000 mL via INTRAVENOUS

## 2024-03-18 MED ORDER — DOXYCYCLINE HYCLATE 100 MG PO CAPS
100.0000 mg | ORAL_CAPSULE | Freq: Two times a day (BID) | ORAL | 0 refills | Status: DC
Start: 2024-03-18 — End: 2024-08-07

## 2024-03-18 NOTE — ED Provider Notes (Signed)
 [place] EMERGENCY DEPARTMENT AT [facility] Provider Note   CSN: [id number] Arrival date & time: 03/18/24  1228     History  No chief complaint on file.   Sandra Herrera is a 78 y.o. female.  78 year old female with past medical history of hypertension hyperlipidemia presenting to the emergency department today with an episode of lightheadedness this morning.  The patient was treated for sinusitis yesterday at urgent care.  She took her first dose of Augmentin last night.  States she has been having some nasal congestion now for the past 1-1/2 weeks.  She states that she woke up about 4 in the morning and had an episode of lightheadedness when she walked to the bathroom.  She states that since then she has had 2 loose stools.  She denies any associated chest pain or shortness of breath.  She came to the ER for further evaluation regarding this.        Home Medications Prior to Admission medications   Medication Sig Start Date End Date Taking? Authorizing Provider  doxycycline (VIBRAMYCIN) 100 MG capsule Take 1 capsule (100 mg total) by mouth 2 (two) times daily. 03/18/24  Yes Durwin Glaze, MD  chlorhexidine (PERIDEX) 0.12 % solution 15 mLs 2 (two) times daily. 01/19/21   [provider]  ezetimibe (ZETIA) 10 MG tablet 1 tablet 11/18/15   [provider]  fluticasone (FLONASE) 50 MCG/ACT nasal spray Place 2 sprays into both nostrils daily for 7 days. 03/03/21 07/30/21  Linwood Dibbles, MD  hydrochlorothiazide (HYDRODIURIL) 12.5 MG tablet 1 tablet    [provider]  ibuprofen (ADVIL,MOTRIN) 600 MG tablet  10/13/16   [provider]  loratadine (CLARITIN) 10 MG tablet Take 1 tablet (10 mg total) by mouth daily. 03/03/21   Linwood Dibbles, MD  meclizine (ANTIVERT) 25 MG tablet 1 tablet as needed    [provider]  methylPREDNISolone (MEDROL DOSEPAK) 4 MG TBPK tablet follow package directions 10/10/16   [provider]   pseudoephedrine (SUDAFED 12 HOUR) 120 MG 12 hr tablet Take 1 tablet (120 mg total) by mouth every 12 (twelve) hours. 03/03/21   Linwood Dibbles, MD  VITAMIN D, CHOLECALCIFEROL, PO Take by mouth.    [provider]      Allergies    Levonorgestrel-ethinyl estrad, Alendronate sodium, Ibandronate, Pravastatin sodium, and Rosuvastatin    Review of Systems   Review of Systems  HENT:  Positive for congestion and sinus pressure.   Neurological:  Positive for light-headedness.  All other systems reviewed and are negative.   Physical Exam Updated Vital Signs BP (!) 153/92   Pulse 72   Temp 98.4 F (36.9 C) (Oral)   Resp 20   Ht 5\' 4"  (1.626 m)   Wt 66.7 kg   SpO2 100%   BMI 25.23 kg/m  Physical Exam Vitals and nursing note reviewed.   Gen: NAD Eyes: PERRL, EOMI HEENT: no oropharyngeal swelling, frontal sinus tenderness noted, + congestion Neck: trachea midline Resp: clear to auscultation bilaterally Card: RRR, no murmurs, rubs, or gallops Abd: nontender, nondistended Extremities: no calf tenderness, no edema Vascular: 2+ radial pulses bilaterally, 2+ DP pulses bilaterally Neuro: no focal deficits, no dysmetria on finger to nose testing Skin: no rashes Psyc: acting appropriately   ED Results / Procedures / Treatments   Labs (all labs ordered are listed, but only abnormal results are displayed) Labs Reviewed  BASIC METABOLIC PANEL - Abnormal; Notable for the following components:  Result Value   BUN 5 (*)    Calcium 10.4 (*)    All other components within normal limits  CBC  TROPONIN I (HIGH SENSITIVITY)    EKG EKG Interpretation Date/Time:  "Sunday March 18 2024 12:33:29 EDT Ventricular Rate:  63 PR Interval:  163 QRS Duration:  129 QT Interval:  418 QTC Calculation: 428 R Axis:   -81  Text Interpretation: Sinus rhythm 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 Numerous leads and wires project over the chest. Midline trachea. Borderline cardiomegaly. No pleural effusion or pneumothorax. Clear lungs. IMPRESSION: No acute cardiopulmonary disease. Electronically Signed   By: Kyle  Talbot M.D.   On: 03/18/2024 13:35    Procedures Procedures    Medications Ordered in ED Medications  doxycycline (VIBRA-TABS) tablet 100 mg (has no administration in time range)  lactated ringers bolus 1,000 mL (0 mLs Intravenous Stopped 03/18/24 1420)    ED Course/ Medical Decision Making/ A&P                                 Medical Decision Making 78 year old female with past medical history of hypertension hyperlipidemia presenting to the emergency department today with an episode of lightheadedness earlier this morning.  This has since resolved.  Started about 4 AM.  I will further evaluate patient here with basic labs as well as a repeat EKG here as well as a troponin to evaluate for atypical ACS.  Acute patient on the cardiac monitor here to evaluate for arrhythmias.  I will give the patient IV fluids here and reevaluate for ultimate disposition.  Patient is otherwise well-appearing with stable vital signs and she was sent here for an abnormal EKG.  We do not have an old one to compare to in our system but I do not appreciate any obvious ST changes consistent with ischemia at this time but will further evaluate both troponin.  The patient's labs here are reassuring.  The patient is feeling better on reassessment.  Her vital signs remained stable and she remained stable on the cardiac monitor.  We discussed observation admission for telemetry versus going home with close outpatient follow-up.  The patient ultimately like to go home through shared decision making.   She is discharged with return precautions.  She was having the GI upset with the Augmentin so after discussion with the patient I have switched her to doxycycline since she has only received 1 dose of the Augmentin did not seem to tolerate that well.  She is discharged with return precautions.  Amount and/or Complexity of Data Reviewed Labs: ordered. Radiology: ordered.  Risk Prescription drug management.           Final Clinical Impression(s) / ED Diagnoses Final diagnoses:  Sinusitis, unspecified chronicity, unspecified location  Lightheadedness    Rx / DC Orders ED Discharge Orders          Ordered    doxycycline (VIBRAMYCIN) 100 MG capsule  2 times daily        03" /23/25 1425              Durwin Glaze, MD 03/18/24 1426

## 2024-03-18 NOTE — ED Notes (Signed)
Patient ambulated to bathroom with minimal assistance.  

## 2024-03-18 NOTE — ED Triage Notes (Signed)
 Patient arrives via [facility] for abnormal EKG, flipped T waves. Abx x1 day for sinus infection x 7 days. Endorses dizziness this am, no CP, SOB, or dizziness at this time. Patient only endorses sinus pressure. No EKG changes with EMS.  BP 180/80

## 2024-03-18 NOTE — ED Notes (Signed)
 Patient discharged by this RN. Patient verbalizes understanding of instructions with no additional questions. Ambulatory to lobby at time of discharge.

## 2024-03-18 NOTE — Discharge Instructions (Signed)
 Your workup today was reassuring.  Please take the doxycycline.  Stop taking the Augmentin since you are having some GI upset with this.  Please follow-up with your doctor for reevaluation.  Return to the ER for worsening symptoms.

## 2024-03-21 DIAGNOSIS — I10 Essential (primary) hypertension: Secondary | ICD-10-CM | POA: Diagnosis not present

## 2024-03-21 DIAGNOSIS — J0110 Acute frontal sinusitis, unspecified: Secondary | ICD-10-CM | POA: Diagnosis not present

## 2024-03-23 DIAGNOSIS — C50912 Malignant neoplasm of unspecified site of left female breast: Secondary | ICD-10-CM | POA: Diagnosis not present

## 2024-04-24 DIAGNOSIS — E785 Hyperlipidemia, unspecified: Secondary | ICD-10-CM | POA: Diagnosis not present

## 2024-04-24 DIAGNOSIS — I10 Essential (primary) hypertension: Secondary | ICD-10-CM | POA: Diagnosis not present

## 2024-06-12 ENCOUNTER — Other Ambulatory Visit: Payer: Self-pay | Admitting: Family Medicine

## 2024-06-12 DIAGNOSIS — Z1231 Encounter for screening mammogram for malignant neoplasm of breast: Secondary | ICD-10-CM

## 2024-06-20 DIAGNOSIS — H35033 Hypertensive retinopathy, bilateral: Secondary | ICD-10-CM | POA: Diagnosis not present

## 2024-06-20 DIAGNOSIS — H5213 Myopia, bilateral: Secondary | ICD-10-CM | POA: Diagnosis not present

## 2024-06-20 DIAGNOSIS — H524 Presbyopia: Secondary | ICD-10-CM | POA: Diagnosis not present

## 2024-06-20 DIAGNOSIS — H1045 Other chronic allergic conjunctivitis: Secondary | ICD-10-CM | POA: Diagnosis not present

## 2024-06-20 DIAGNOSIS — H16223 Keratoconjunctivitis sicca, not specified as Sjogren's, bilateral: Secondary | ICD-10-CM | POA: Diagnosis not present

## 2024-06-20 DIAGNOSIS — Z961 Presence of intraocular lens: Secondary | ICD-10-CM | POA: Diagnosis not present

## 2024-06-25 DIAGNOSIS — I10 Essential (primary) hypertension: Secondary | ICD-10-CM | POA: Diagnosis not present

## 2024-06-25 DIAGNOSIS — E785 Hyperlipidemia, unspecified: Secondary | ICD-10-CM | POA: Diagnosis not present

## 2024-07-06 ENCOUNTER — Ambulatory Visit
Admission: RE | Admit: 2024-07-06 | Discharge: 2024-07-06 | Disposition: A | Discharge status: Home / Self Care | Source: Ambulatory Visit | Attending: Family Medicine | Admitting: Family Medicine

## 2024-07-06 DIAGNOSIS — Z1231 Encounter for screening mammogram for malignant neoplasm of breast: Secondary | ICD-10-CM | POA: Diagnosis not present

## 2024-07-10 ENCOUNTER — Ambulatory Visit: Attending: Cardiology | Admitting: Internal Medicine

## 2024-07-10 ENCOUNTER — Encounter: Payer: Self-pay | Admitting: Internal Medicine

## 2024-07-10 VITALS — BP 146/94 | HR 63 | Ht 64.0 in | Wt 148.0 lb

## 2024-07-10 DIAGNOSIS — I10 Essential (primary) hypertension: Secondary | ICD-10-CM | POA: Insufficient documentation

## 2024-07-10 DIAGNOSIS — Z139 Encounter for screening, unspecified: Secondary | ICD-10-CM

## 2024-07-10 DIAGNOSIS — I7121 Aneurysm of the ascending aorta, without rupture: Secondary | ICD-10-CM | POA: Insufficient documentation

## 2024-07-10 DIAGNOSIS — I2510 Atherosclerotic heart disease of native coronary artery without angina pectoris: Secondary | ICD-10-CM | POA: Diagnosis not present

## 2024-07-10 DIAGNOSIS — R0609 Other forms of dyspnea: Secondary | ICD-10-CM | POA: Insufficient documentation

## 2024-07-10 DIAGNOSIS — I700 Atherosclerosis of aorta: Secondary | ICD-10-CM | POA: Insufficient documentation

## 2024-07-10 DIAGNOSIS — I452 Bifascicular block: Secondary | ICD-10-CM | POA: Diagnosis not present

## 2024-07-10 DIAGNOSIS — E785 Hyperlipidemia, unspecified: Secondary | ICD-10-CM | POA: Diagnosis not present

## 2024-07-10 MED ORDER — ATORVASTATIN CALCIUM 40 MG PO TABS: 40.0000 mg | ORAL_TABLET | Freq: Every day | ORAL | 3 refills | Status: AC

## 2024-07-10 NOTE — Patient Instructions (Addendum)
 Medication Instructions:  STOP Rosuvastatin  START Atorvastatin  (LIPITOR) 40 mg daily *If you need a refill on your cardiac medications before your next appointment, please call your pharmacy*  Lab Work: FASTING Lipids- NMR and LPa in 3 months (OCTOBER) If you have labs (blood work) drawn today and your tests are completely normal, you will receive your results only by: MyChart Message (if you have MyChart) OR A paper copy in the mail If you have any lab test that is abnormal or we need to change your treatment, we will call you to review the results.  Testing/Procedures: Your physician has requested that you have an echocardiogram. Echocardiography is a painless test that uses sound waves to create images of your heart. It provides your doctor with information about the size and shape of your heart and how well your heart's chambers and valves are working. This procedure takes approximately one hour. There are no restrictions for this procedure. Please do NOT wear cologne, perfume, aftershave, or lotions (deodorant is allowed). Please arrive 15 minutes prior to your appointment time.  Your physician has requested that you have cardiac CT. Cardiac computed tomography (CT) is a painless test that uses an x-ray machine to take clear, detailed pictures of your heart. For further information please visit [email or web address] Please follow instruction sheet as given.    Your cardiac CT will be scheduled at one of the below locations:   [facility] [address] [place], [place] [address] [phone]  OR  [facility] [address] [place], [place] [address] [phone]  OR   [facility] [address] [place], [place] [address] [phone]  OR   [facility] [address] [place], [place] [address] [phone]  OR   [facility] [address]  [place], [place] [address]  If scheduled at [facility], please arrive at the [facility] and Children's Entrance (Entrance C2) of [facility] 30 minutes prior to test start time. You can use the FREE valet parking offered at entrance C (encouraged to control the heart rate for the test)  Proceed to the [facility] Radiology Department (first floor) to check-in and test prep.   All radiology patients and guests should use entrance C2 at [facility], accessed from [facility], even though the hospital's physical address listed is [address]    If scheduled at the Heart and Vascular Tower at [address], please enter the parking lot using the [address] entrance and use the FREE valet service at the patient drop-off area. Enter the buidling and check-in with registration on the main floor.  If scheduled at [facility] or [facility], please arrive 15 mins early for check-in and test prep.  There is spacious parking and easy access to the radiology department from the [facility] Heart and Vascular entrance. Please enter here and check-in with the desk attendant.   If scheduled at [facility], please arrive 30 minutes early for check-in and test prep.  Please follow these instructions carefully (unless otherwise directed):  An IV will be required for this test and Nitroglycerin will be given.  Hold all erectile dysfunction medications at least 3 days (72 hrs) prior to test. (Ie viagra, cialis, sildenafil, tadalafil, etc)   On the Night Before the Test: Be sure to Drink plenty of water. Do not consume any caffeinated/decaffeinated  beverages or chocolate 12 hours prior to your test. Do not take any antihistamines 12 hours prior to your test.   On the Day of the Test: Drink plenty of water until 1 hour prior to the test. Do not eat any food 1 hour prior to test. You may take your  regular medications prior to the test.  If you take Furosemide/Hydrochlorothiazide/Spironolactone/Chlorthalidone, please HOLD on the morning of the test. Patients who wear a continuous glucose monitor MUST remove the device prior to scanning. FEMALES- please wear underwire-free bra if available, avoid dresses & tight clothing  After the Test: Drink plenty of water. After receiving IV contrast, you may experience a mild flushed feeling. This is normal. On occasion, you may experience a mild rash up to 24 hours after the test. This is not dangerous. If this occurs, you can take Benadryl 25 mg, Zyrtec, Claritin , or Allegra and increase your fluid intake. (Patients taking Tikosyn should avoid Benadryl, and may take Zyrtec, Claritin , or Allegra) If you experience trouble breathing, this can be serious. If it is severe call 911 IMMEDIATELY. If it is mild, please call our office.  We will call to schedule your test 2-4 weeks out understanding that some insurance companies will need an authorization prior to the service being performed.   For more information and frequently asked questions, please visit our website : [email or web address]  For non-scheduling related questions, please contact the cardiac imaging nurse navigator should you have any questions/concerns: Cardiac Imaging Nurse Navigators Direct Office Dial: [phone]   For scheduling needs, including cancellations and rescheduling, please call [place], [phone].   Your next appointment:   TBD- based on test and lab results

## 2024-07-10 NOTE — Progress Notes (Signed)
 OFFICE CONSULT NOTE  Chief Complaint:  ER follow-up, dizziness, short of breath  Primary Care Physician: Sandra Channel, MD  HPI:  Sandra Herrera is a 78 y.o. female who is being seen today for the evaluation of ER follow-up at the request of Sandra Channel, MD. This is a pleasant 78 year old female whose daughter is a patient of mine.  She was in the emergency department in March with dizziness and lightheadedness.  She had had some nasal congestion and was diagnosed with a sinusitis at urgent care but she noted some lightheadedness and shortness of breath.  EMS EKG and EKG in the ER was personally reviewed and I reviewed those today myself indicating a bifascicular block with sinus rhythm and anterolateral T wave inversions.  ER workup for ischemia was negative including a negative troponin.  Unfortunately, there was no old EKG to compare with.  She was discharged and returns today for follow-up.  She reports she has not had any further dizziness or lightheadedness.  She is very active and walks regularly.  She does get some shortness of breath when walking up hills.  EKG today again shows bifascicular block and looks similar to her study in March.  She did report today that her biologic father had an MI in his 72s which is a significant risk factor.  She also had imaging back in 2022 at time when she said she had COVID-19.  Review of that CT angiogram is performed to rule out pulmonary embolism noted no evidence of PE however she had an aortic aneurysm which was dilated to 4.4 cm and coronary artery calcifications and aortic atherosclerosis.  I do not see if she has had any follow-up imaging of this in the past 3 years.  Blood pressure is elevated today although she says she has some whitecoat hypertension and generally her hypertension is well-controlled at home.  She also has a history of dyslipidemia on ezetimibe and rosuvastatin 10 mg twice weekly.  She said she could not tolerate  higher doses of statins.  Recent lipids in April showed total cholesterol 215, HDL 60, triglycerides 106 and LDL 136.  Based on her calcifications and atherosclerosis her target LDL should be less than 70.  PMHx:  Past Medical History:  Diagnosis Date   Breast cancer (HCC)    left mastectomy 1995   Hypercholesteremia    Personal history of chemotherapy    Personal history of radiation therapy     Past Surgical History:  Procedure Laterality Date   MASTECTOMY Left 1995    FAMHx:  Family History  Problem Relation Age of Onset   Breast cancer Maternal Aunt     SOCHx:   reports that she has never smoked. She has never used smokeless tobacco. She reports that she does not drink alcohol and does not use drugs.  ALLERGIES:  Allergies  Allergen Reactions   Levonorgestrel-Ethinyl Estrad Other (See Comments)    Sinus issues flare up   Alendronate Sodium Other (See Comments)   Ibandronate Other (See Comments)   Pravastatin Sodium Other (See Comments)   Rosuvastatin Other (See Comments)    ROS: Pertinent items noted in HPI and remainder of comprehensive ROS otherwise negative.  HOME MEDS: Current Outpatient Medications on File Prior to Visit  Medication Sig Dispense Refill   amLODipine (NORVASC) 5 MG tablet Take 5 mg by mouth daily.     escitalopram (LEXAPRO) 5 MG tablet Take 5 mg by mouth daily.     ezetimibe (ZETIA)  10 MG tablet 1 tablet     fluticasone  (FLONASE ) 50 MCG/ACT nasal spray Place 2 sprays into both nostrils daily for 7 days. 9.9 mL 1   hydrochlorothiazide (HYDRODIURIL) 12.5 MG tablet 1 tablet     ibuprofen (ADVIL,MOTRIN) 600 MG tablet      loratadine  (CLARITIN ) 10 MG tablet Take 1 tablet (10 mg total) by mouth daily. 14 tablet 0   meclizine (ANTIVERT) 25 MG tablet 1 tablet as needed     pseudoephedrine  (SUDAFED 12 HOUR) 120 MG 12 hr tablet Take 1 tablet (120 mg total) by mouth every 12 (twelve) hours. 14 tablet 0   rosuvastatin (CRESTOR) 10 MG tablet Take 10 mg  by mouth. Taking twice a week     VITAMIN D, CHOLECALCIFEROL, PO Take by mouth.     chlorhexidine (PERIDEX) 0.12 % solution 15 mLs 2 (two) times daily.     doxycycline  (VIBRAMYCIN ) 100 MG capsule Take 1 capsule (100 mg total) by mouth 2 (two) times daily. 14 capsule 0   methylPREDNISolone (MEDROL DOSEPAK) 4 MG TBPK tablet follow package directions     No current facility-administered medications on file prior to visit.    LABS/IMAGING: No results found for this or any previous visit (from the past 48 hours). No results found.  LIPID PANEL: No results found for: CHOL, TRIG, HDL, CHOLHDL, VLDL, LDLCALC, LDLDIRECT  WEIGHTS: Wt Readings from Last 3 Encounters:  07/10/24 148 lb (67.1 kg)  03/18/24 147 lb (66.7 kg)  11/23/21 147 lb (66.7 kg)    VITALS: BP (!) 146/94   Pulse 63   Ht 5' 4 (1.626 m)   Wt 148 lb (67.1 kg)   SpO2 98%   BMI 25.40 kg/m   EXAM: General appearance: alert and no distress Lungs: clear to auscultation bilaterally Heart: regular rate and rhythm, S1, S2 normal, no murmur, click, rub or gallop Extremities: extremities normal, atraumatic, no cyanosis or edema Neurologic: Grossly normal  EKG: EKG Interpretation Date/Time:  Tuesday July 10 2024 08:13:55 EDT Ventricular Rate:  63 PR Interval:  166 QRS Duration:  126 QT Interval:  438 QTC Calculation: 448 R Axis:   -70  Text Interpretation: Normal sinus rhythm Right bundle branch block Left anterior fascicular block Bifascicular block Minimal voltage criteria for LVH, may be normal variant ( R in aVL ) Septal infarct , age undetermined When compared with ECG of 18-Mar-2024 12:33, No significant change since last tracing Confirmed by Sandra Herrera [phone]) on 07/10/2024 8:27:28 AM  - personally reviewed  ASSESSMENT: Dizziness and dyspnea on exertion 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   Ms. Sandra Herrera has had some recent dizziness and dyspnea on exertion mostly when walking up hills.  She denies any chest pain.  She does have coronary artery calcification noted on CT scan in 2022.  At that time she was noted also to have a mild aortic ascending aortic aneurysm at 4.4 cm however this has not been reimaged to my knowledge.  We will need to readdress this.  Since she has been potentially symptomatic and has coronary calcification and an abnormal EKG demonstrating bifascicular block, I would like to assess for the potential of obstructive coronary disease.  Will plan for CT coronary angiogram.  Since she has the bifascicular block and heart rates in the low 60s I do not think she will need any premedication  for the study.  In addition I like to get an echocardiogram to evaluate for any ventricular dysfunction.  With regards to her risk modification, she has not been able to tolerate higher dose statins and her LDL is about 50% above where she should be.  I would advise stopping her rosuvastatin and switch her to atorvastatin  40 mg daily.  If this is tolerated we will plan lipids including NMR and LP(a) in about 3 months and follow-up afterwards.  If there are any abnormalities obviously we may need to order additional tests sooner.  Sandra KYM Maxcy, MD, [facility], FNLA, FACP  [facility]  Medical Director of the [facility] Lipid [facility] &  Cardiovascular Risk Reduction Clinic Diplomate of the American Board of Clinical Lipidology Attending Cardiologist  Direct Dial: [phone]  Fax: [phone]  Website:  [email or web address] Sandra Herrera Sandra Herrera 07/10/2024, 8:27 AM

## 2024-07-10 NOTE — H&P (View-Only) (Signed)
 OFFICE CONSULT NOTE  Chief Complaint:  ER follow-up, dizziness, short of breath  Primary Care Physician: Sandra Channel, MD  HPI:  Sandra Herrera is a 78 y.o. female who is being seen today for the evaluation of ER follow-up at the request of Sandra Channel, MD. This is a pleasant 78 year old female whose daughter is a patient of mine.  She was in the emergency department in March with dizziness and lightheadedness.  She had had some nasal congestion and was diagnosed with a sinusitis at urgent care but she noted some lightheadedness and shortness of breath.  EMS EKG and EKG in the ER was personally reviewed and I reviewed those today myself indicating a bifascicular block with sinus rhythm and anterolateral T wave inversions.  ER workup for ischemia was negative including a negative troponin.  Unfortunately, there was no old EKG to compare with.  She was discharged and returns today for follow-up.  She reports she has not had any further dizziness or lightheadedness.  She is very active and walks regularly.  She does get some shortness of breath when walking up hills.  EKG today again shows bifascicular block and looks similar to her study in March.  She did report today that her biologic father had an MI in his 72s which is a significant risk factor.  She also had imaging back in 2022 at time when she said she had COVID-19.  Review of that CT angiogram is performed to rule out pulmonary embolism noted no evidence of PE however she had an aortic aneurysm which was dilated to 4.4 cm and coronary artery calcifications and aortic atherosclerosis.  I do not see if she has had any follow-up imaging of this in the past 3 years.  Blood pressure is elevated today although she says she has some whitecoat hypertension and generally her hypertension is well-controlled at home.  She also has a history of dyslipidemia on ezetimibe and rosuvastatin 10 mg twice weekly.  She said she could not tolerate  higher doses of statins.  Recent lipids in April showed total cholesterol 215, HDL 60, triglycerides 106 and LDL 136.  Based on her calcifications and atherosclerosis her target LDL should be less than 70.  PMHx:  Past Medical History:  Diagnosis Date   Breast cancer (HCC)    left mastectomy 1995   Hypercholesteremia    Personal history of chemotherapy    Personal history of radiation therapy     Past Surgical History:  Procedure Laterality Date   MASTECTOMY Left 1995    FAMHx:  Family History  Problem Relation Age of Onset   Breast cancer Maternal Aunt     SOCHx:   reports that she has never smoked. She has never used smokeless tobacco. She reports that she does not drink alcohol and does not use drugs.  ALLERGIES:  Allergies  Allergen Reactions   Levonorgestrel-Ethinyl Estrad Other (See Comments)    Sinus issues flare up   Alendronate Sodium Other (See Comments)   Ibandronate Other (See Comments)   Pravastatin Sodium Other (See Comments)   Rosuvastatin Other (See Comments)    ROS: Pertinent items noted in HPI and remainder of comprehensive ROS otherwise negative.  HOME MEDS: Current Outpatient Medications on File Prior to Visit  Medication Sig Dispense Refill   amLODipine (NORVASC) 5 MG tablet Take 5 mg by mouth daily.     escitalopram (LEXAPRO) 5 MG tablet Take 5 mg by mouth daily.     ezetimibe (ZETIA)  10 MG tablet 1 tablet     fluticasone  (FLONASE ) 50 MCG/ACT nasal spray Place 2 sprays into both nostrils daily for 7 days. 9.9 mL 1   hydrochlorothiazide (HYDRODIURIL) 12.5 MG tablet 1 tablet     ibuprofen (ADVIL,MOTRIN) 600 MG tablet      loratadine  (CLARITIN ) 10 MG tablet Take 1 tablet (10 mg total) by mouth daily. 14 tablet 0   meclizine (ANTIVERT) 25 MG tablet 1 tablet as needed     pseudoephedrine  (SUDAFED 12 HOUR) 120 MG 12 hr tablet Take 1 tablet (120 mg total) by mouth every 12 (twelve) hours. 14 tablet 0   rosuvastatin (CRESTOR) 10 MG tablet Take 10 mg  by mouth. Taking twice a week     VITAMIN D, CHOLECALCIFEROL, PO Take by mouth.     chlorhexidine (PERIDEX) 0.12 % solution 15 mLs 2 (two) times daily.     doxycycline  (VIBRAMYCIN ) 100 MG capsule Take 1 capsule (100 mg total) by mouth 2 (two) times daily. 14 capsule 0   methylPREDNISolone (MEDROL DOSEPAK) 4 MG TBPK tablet follow package directions     No current facility-administered medications on file prior to visit.    LABS/IMAGING: No results found for this or any previous visit (from the past 48 hours). No results found.  LIPID PANEL: No results found for: CHOL, TRIG, HDL, CHOLHDL, VLDL, LDLCALC, LDLDIRECT  WEIGHTS: Wt Readings from Last 3 Encounters:  07/10/24 148 lb (67.1 kg)  03/18/24 147 lb (66.7 kg)  11/23/21 147 lb (66.7 kg)    VITALS: BP (!) 146/94   Pulse 63   Ht 5' 4 (1.626 m)   Wt 148 lb (67.1 kg)   SpO2 98%   BMI 25.40 kg/m   EXAM: General appearance: alert and no distress Lungs: clear to auscultation bilaterally Heart: regular rate and rhythm, S1, S2 normal, no murmur, click, rub or gallop Extremities: extremities normal, atraumatic, no cyanosis or edema Neurologic: Grossly normal  EKG: EKG Interpretation Date/Time:  Tuesday July 10 2024 08:13:55 EDT Ventricular Rate:  63 PR Interval:  166 QRS Duration:  126 QT Interval:  438 QTC Calculation: 448 R Axis:   -70  Text Interpretation: Normal sinus rhythm Right bundle branch block Left anterior fascicular block Bifascicular block Minimal voltage criteria for LVH, may be normal variant ( R in aVL ) Septal infarct , age undetermined When compared with ECG of 18-Mar-2024 12:33, No significant change since last tracing Confirmed by Sandra Herrera [phone]) on 07/10/2024 8:27:28 AM  - personally reviewed  ASSESSMENT: Dizziness and dyspnea on exertion 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   [name] has had some recent dizziness and dyspnea on exertion mostly when walking up hills.  She denies any chest pain.  She does have coronary artery calcification noted on CT scan in 2022.  At that time she was noted also to have a mild aortic ascending aortic aneurysm at 4.4 cm however this has not been reimaged to my knowledge.  We will need to readdress this.  Since she has been potentially symptomatic and has coronary calcification and an abnormal EKG demonstrating bifascicular block, I would like to assess for the potential of obstructive coronary disease.  Will plan for CT coronary angiogram.  Since she has the bifascicular block and heart rates in the low 60s I do not think she will need any premedication  for the study.  In addition I like to get an echocardiogram to evaluate for any ventricular dysfunction.  With regards to her risk modification, she has not been able to tolerate higher dose statins and her LDL is about 50% above where she should be.  I would advise stopping her rosuvastatin and switch her to atorvastatin  40 mg daily.  If this is tolerated we will plan lipids including NMR and LP(a) in about 3 months and follow-up afterwards.  If there are any abnormalities obviously we may need to order additional tests sooner.  Sandra KYM Maxcy, MD, [facility], FNLA, FACP  [facility]  Medical Director of the [facility]  [facility] Diplomate of the American Board of Clinical Lipidology Attending Cardiologist  Direct Dial: [phone]  Fax: [phone]  Website:  [email or web address] Sandra Herrera Sandra Herrera 07/10/2024, 8:27 AM

## 2024-07-13 ENCOUNTER — Encounter (HOSPITAL_COMMUNITY): Payer: Self-pay

## 2024-07-16 ENCOUNTER — Telehealth (HOSPITAL_COMMUNITY): Payer: Self-pay | Admitting: *Deleted

## 2024-07-16 NOTE — Telephone Encounter (Signed)
 Reaching out to patient to offer assistance regarding upcoming cardiac imaging study; pt verbalizes understanding of appt date/time, parking situation and where to check in, pre-test NPO status, and verified current allergies; name and call back number provided for further questions should they arise  Larey Brick RN Navigator Cardiac Imaging [facility] [phone] office [phone] cell

## 2024-07-17 ENCOUNTER — Ambulatory Visit (HOSPITAL_COMMUNITY)
Admission: RE | Admit: 2024-07-17 | Discharge: 2024-07-17 | Disposition: A | Discharge status: Home / Self Care | Source: Ambulatory Visit | Attending: Internal Medicine | Admitting: Internal Medicine

## 2024-07-17 DIAGNOSIS — R42 Dizziness and giddiness: Secondary | ICD-10-CM | POA: Insufficient documentation

## 2024-07-17 DIAGNOSIS — R9431 Abnormal electrocardiogram [ECG] [EKG]: Secondary | ICD-10-CM | POA: Diagnosis not present

## 2024-07-17 DIAGNOSIS — R5383 Other fatigue: Secondary | ICD-10-CM | POA: Insufficient documentation

## 2024-07-17 DIAGNOSIS — I7121 Aneurysm of the ascending aorta, without rupture: Secondary | ICD-10-CM | POA: Diagnosis not present

## 2024-07-17 DIAGNOSIS — I2510 Atherosclerotic heart disease of native coronary artery without angina pectoris: Secondary | ICD-10-CM | POA: Diagnosis not present

## 2024-07-17 MED ORDER — NITROGLYCERIN 0.4 MG SL SUBL
0.8000 mg | SUBLINGUAL_TABLET | Freq: Once | SUBLINGUAL | Status: AC
  Administered 2024-07-17: 0.8 mg via SUBLINGUAL

## 2024-07-17 MED ORDER — IOHEXOL 350 MG/ML SOLN
100.0000 mL | Freq: Once | INTRAVENOUS | Status: AC | PRN
  Administered 2024-07-17: 100 mL via INTRAVENOUS

## 2024-07-21 ENCOUNTER — Other Ambulatory Visit: Payer: Self-pay | Admitting: Cardiology

## 2024-07-21 ENCOUNTER — Ambulatory Visit (HOSPITAL_COMMUNITY)
Admission: RE | Admit: 2024-07-21 | Discharge: 2024-07-21 | Disposition: A | Discharge status: Home / Self Care | Source: Ambulatory Visit | Attending: Cardiology | Admitting: Cardiology

## 2024-07-21 DIAGNOSIS — I2510 Atherosclerotic heart disease of native coronary artery without angina pectoris: Secondary | ICD-10-CM

## 2024-07-21 DIAGNOSIS — R931 Abnormal findings on diagnostic imaging of heart and coronary circulation: Secondary | ICD-10-CM | POA: Insufficient documentation

## 2024-07-23 ENCOUNTER — Encounter: Payer: Self-pay | Admitting: Internal Medicine

## 2024-07-24 ENCOUNTER — Ambulatory Visit: Payer: Self-pay | Admitting: Internal Medicine

## 2024-07-24 DIAGNOSIS — I10 Essential (primary) hypertension: Secondary | ICD-10-CM

## 2024-07-24 DIAGNOSIS — Z01812 Encounter for preprocedural laboratory examination: Secondary | ICD-10-CM

## 2024-07-26 DIAGNOSIS — E785 Hyperlipidemia, unspecified: Secondary | ICD-10-CM | POA: Diagnosis not present

## 2024-07-26 DIAGNOSIS — I10 Essential (primary) hypertension: Secondary | ICD-10-CM | POA: Diagnosis not present

## 2024-07-26 NOTE — Telephone Encounter (Signed)
 Spoke with patient and scheduled left heart cath for August 14th with Dr. Wendel.  Patient will need CBC, BMET prior to cath. Patient will plan to have drawn before leaving for beach on August 6th.  Cath instructions reviewed and sent to patient via MyChart.  Patient verbalized understanding and expressed appreciation for call.

## 2024-07-30 ENCOUNTER — Telehealth (HOSPITAL_BASED_OUTPATIENT_CLINIC_OR_DEPARTMENT_OTHER): Payer: Self-pay

## 2024-07-30 MED ORDER — ASPIRIN 81 MG PO TBEC: 81.0000 mg | DELAYED_RELEASE_TABLET | Freq: Every day | ORAL | 12 refills | Status: AC

## 2024-07-30 NOTE — Telephone Encounter (Signed)
 Called patient. Verified name and DOB. Relayed recommendation from Dr. Mona to start Aspirin  81 mg daily. I informed patient she can obtain over the counter. Patient asked for a prescription to be sent to pharmacy.

## 2024-07-30 NOTE — Addendum Note (Signed)
 Addended by: ANDREZ PRAIRIE on: 07/30/2024 01:09 PM   Modules accepted: Orders

## 2024-07-31 LAB — CBC
Hematocrit: 36.7 % (ref 34.0–46.6)
Hemoglobin: 11.8 g/dL (ref 11.1–15.9)
MCH: 27.6 pg (ref 26.6–33.0)
MCHC: 32.2 g/dL (ref 31.5–35.7)
MCV: 86 fL (ref 79–97)
Platelets: 222 x10E3/uL (ref 150–450)
RBC: 4.28 x10E6/uL (ref 3.77–5.28)
RDW: 13.4 % (ref 11.7–15.4)
WBC: 5.1 x10E3/uL (ref 3.4–10.8)

## 2024-07-31 LAB — BASIC METABOLIC PANEL WITH GFR
BUN/Creatinine Ratio: 13 (ref 12–28)
BUN: 10 mg/dL (ref 8–27)
CO2: 22 mmol/L (ref 20–29)
Calcium: 10.1 mg/dL (ref 8.7–10.3)
Chloride: 104 mmol/L (ref 96–106)
Creatinine, Ser: 0.8 mg/dL (ref 0.57–1.00)
Glucose: 91 mg/dL (ref 70–99)
Potassium: 3.9 mmol/L (ref 3.5–5.2)
Sodium: 142 mmol/L (ref 134–144)
eGFR: 76 mL/min/1.73 (ref >59)

## 2024-08-07 ENCOUNTER — Telehealth: Payer: Self-pay | Admitting: *Deleted

## 2024-08-07 NOTE — Telephone Encounter (Signed)
 Cardiac Catheterization scheduled at [facility] for: Thursday August 09, 2024 9 AM Arrival time [facility] Main Entrance A at: 7 AM  Diet: -Nothing to eat after midnight prior to procedure.  Hydration: -May drink clear liquids until leaving for hospital. Approved liquids: Water, clear tea, black coffee, fruit juices-non-citric and without pulp,Gatorade, plain Jello/popsicles.  Drink 16 oz. bottle of water on the way to the hospital.  Medication instructions: -Hold:  Hydrochlorothiazide-AM of procedure  -Other usual morning medications can be taken including aspirin  81 mg.  Plan to go home the same day, you will only stay overnight if medically necessary.  You must have responsible adult to drive you home.  Someone must be with you the first 24 hours after you arrive home.  Reviewed procedure instructions with patient.

## 2024-08-09 ENCOUNTER — Other Ambulatory Visit: Payer: Self-pay

## 2024-08-09 ENCOUNTER — Ambulatory Visit (HOSPITAL_COMMUNITY)
Admission: RE | Admit: 2024-08-09 | Discharge: 2024-08-09 | Disposition: A | Discharge status: Home / Self Care | Attending: Internal Medicine | Admitting: Internal Medicine

## 2024-08-09 ENCOUNTER — Encounter (HOSPITAL_COMMUNITY)
Admission: RE | Disposition: A | Discharge status: Home / Self Care | Payer: Self-pay | Source: Home / Self Care | Attending: Internal Medicine

## 2024-08-09 DIAGNOSIS — Z79899 Other long term (current) drug therapy: Secondary | ICD-10-CM | POA: Diagnosis not present

## 2024-08-09 DIAGNOSIS — I452 Bifascicular block: Secondary | ICD-10-CM | POA: Insufficient documentation

## 2024-08-09 DIAGNOSIS — E785 Hyperlipidemia, unspecified: Secondary | ICD-10-CM | POA: Insufficient documentation

## 2024-08-09 DIAGNOSIS — I7121 Aneurysm of the ascending aorta, without rupture: Secondary | ICD-10-CM | POA: Diagnosis not present

## 2024-08-09 DIAGNOSIS — R079 Chest pain, unspecified: Secondary | ICD-10-CM | POA: Diagnosis not present

## 2024-08-09 DIAGNOSIS — Z8249 Family history of ischemic heart disease and other diseases of the circulatory system: Secondary | ICD-10-CM | POA: Insufficient documentation

## 2024-08-09 DIAGNOSIS — I10 Essential (primary) hypertension: Secondary | ICD-10-CM | POA: Diagnosis not present

## 2024-08-09 DIAGNOSIS — I700 Atherosclerosis of aorta: Secondary | ICD-10-CM | POA: Insufficient documentation

## 2024-08-09 DIAGNOSIS — I2510 Atherosclerotic heart disease of native coronary artery without angina pectoris: Secondary | ICD-10-CM | POA: Diagnosis not present

## 2024-08-09 HISTORY — PX: LEFT HEART CATH AND CORONARY ANGIOGRAPHY: CATH118249

## 2024-08-09 SURGERY — LEFT HEART CATH AND CORONARY ANGIOGRAPHY
Anesthesia: LOCAL

## 2024-08-09 MED ORDER — HEPARIN SODIUM (PORCINE) 1000 UNIT/ML IJ SOLN
INTRAMUSCULAR | Status: AC
Start: 2024-08-09 — End: 2024-08-09
  Filled 2024-08-09: qty 10

## 2024-08-09 MED ORDER — FENTANYL CITRATE (PF) 100 MCG/2ML IJ SOLN
INTRAMUSCULAR | Status: AC
  Filled 2024-08-09: qty 2

## 2024-08-09 MED ORDER — LIDOCAINE HCL (PF) 1 % IJ SOLN
INTRAMUSCULAR | Status: DC | PRN
  Administered 2024-08-09: 2 mL via INTRADERMAL

## 2024-08-09 MED ORDER — ONDANSETRON HCL 4 MG/2ML IJ SOLN
4.0000 mg | Freq: Four times a day (QID) | INTRAMUSCULAR | Status: DC | PRN
Start: 2024-08-09 — End: 2024-08-09

## 2024-08-09 MED ORDER — SODIUM CHLORIDE 0.9 % IV SOLN: 250.0000 mL | INTRAVENOUS | Status: DC | PRN

## 2024-08-09 MED ORDER — ASPIRIN 81 MG PO CHEW: 81.0000 mg | CHEWABLE_TABLET | ORAL | Status: DC

## 2024-08-09 MED ORDER — SODIUM CHLORIDE 0.9% FLUSH: 3.0000 mL | INTRAVENOUS | Status: DC | PRN

## 2024-08-09 MED ORDER — MIDAZOLAM HCL 2 MG/2ML IJ SOLN
INTRAMUSCULAR | Status: AC
  Filled 2024-08-09: qty 2

## 2024-08-09 MED ORDER — HEPARIN SODIUM (PORCINE) 1000 UNIT/ML IJ SOLN
INTRAMUSCULAR | Status: DC | PRN
  Administered 2024-08-09: 5000 [IU] via INTRAVENOUS

## 2024-08-09 MED ORDER — HEPARIN (PORCINE) IN NACL 1000-0.9 UT/500ML-% IV SOLN
INTRAVENOUS | Status: DC | PRN
  Administered 2024-08-09 (×2): 500 mL

## 2024-08-09 MED ORDER — SODIUM CHLORIDE 0.9% FLUSH: 3.0000 mL | Freq: Two times a day (BID) | INTRAVENOUS | Status: DC

## 2024-08-09 MED ORDER — LIDOCAINE HCL (PF) 1 % IJ SOLN
INTRAMUSCULAR | Status: AC
  Filled 2024-08-09: qty 30

## 2024-08-09 MED ORDER — ACETAMINOPHEN 325 MG PO TABS: 650.0000 mg | ORAL_TABLET | ORAL | Status: DC | PRN

## 2024-08-09 MED ORDER — HYDRALAZINE HCL 20 MG/ML IJ SOLN: 10.0000 mg | INTRAMUSCULAR | Status: DC | PRN

## 2024-08-09 MED ORDER — VERAPAMIL HCL 2.5 MG/ML IV SOLN
INTRAVENOUS | Status: AC
  Filled 2024-08-09: qty 2

## 2024-08-09 MED ORDER — FENTANYL CITRATE (PF) 100 MCG/2ML IJ SOLN
INTRAMUSCULAR | Status: DC | PRN
  Administered 2024-08-09: 25 ug via INTRAVENOUS

## 2024-08-09 MED ORDER — FREE WATER: 500.0000 mL | Freq: Once | Status: DC

## 2024-08-09 MED ORDER — VERAPAMIL HCL 2.5 MG/ML IV SOLN
INTRAVENOUS | Status: DC | PRN
  Administered 2024-08-09: 10 mL via INTRA_ARTERIAL

## 2024-08-09 MED ORDER — MIDAZOLAM HCL 2 MG/2ML IJ SOLN
INTRAMUSCULAR | Status: DC | PRN
Start: 2024-08-09 — End: 2024-08-09
  Administered 2024-08-09: 1 mg via INTRAVENOUS

## 2024-08-09 MED ORDER — IOHEXOL 350 MG/ML SOLN
INTRAVENOUS | Status: DC | PRN
  Administered 2024-08-09: 30 mL

## 2024-08-09 MED ORDER — LABETALOL HCL 5 MG/ML IV SOLN: 10.0000 mg | INTRAVENOUS | Status: DC | PRN

## 2024-08-09 SURGICAL SUPPLY — 8 items
CATH INFINITI 5FR ANG PIGTAIL (CATHETERS) IMPLANT
CATH INFINITI AMBI 6FR TG (CATHETERS) IMPLANT
DEVICE RAD TR BAND REGULAR (VASCULAR PRODUCTS) IMPLANT
GLIDESHEATH SLEND SS 6F .021 (SHEATH) IMPLANT
KIT SINGLE USE MANIFOLD (KITS) IMPLANT
PACK CARDIAC CATHETERIZATION (CUSTOM PROCEDURE TRAY) ×1 IMPLANT
SET ATX-X65L (MISCELLANEOUS) IMPLANT
WIRE EMERALD 3MM-J .035X260CM (WIRE) IMPLANT

## 2024-08-09 NOTE — Discharge Instructions (Signed)

## 2024-08-09 NOTE — Interval H&P Note (Signed)
 History and Physical Interval Note:  08/09/2024 8:18 AM  Sandra Herrera  has presented today for surgery, with the diagnosis of abnormal ct - chest pain.  The various methods of treatment have been discussed with the patient and family. After consideration of risks, benefits and other options for treatment, the patient has consented to  Procedure(s): LEFT HEART CATH AND CORONARY ANGIOGRAPHY (N/A) as a surgical intervention.  The patient's history has been reviewed, patient examined, no change in status, stable for surgery.  I have reviewed the patient's chart and labs.  Questions were answered to the patient's satisfaction.     Dannette Kinkaid K Shikha Bibb

## 2024-08-10 ENCOUNTER — Encounter (HOSPITAL_COMMUNITY): Payer: Self-pay | Admitting: Internal Medicine

## 2024-08-14 ENCOUNTER — Encounter: Payer: Self-pay | Admitting: Internal Medicine

## 2024-08-16 ENCOUNTER — Other Ambulatory Visit (HOSPITAL_COMMUNITY)

## 2024-08-24 DIAGNOSIS — G720 Drug-induced myopathy: Secondary | ICD-10-CM | POA: Diagnosis not present

## 2024-08-24 DIAGNOSIS — I10 Essential (primary) hypertension: Secondary | ICD-10-CM | POA: Diagnosis not present

## 2024-08-24 DIAGNOSIS — R7303 Prediabetes: Secondary | ICD-10-CM | POA: Diagnosis not present

## 2024-08-24 DIAGNOSIS — E785 Hyperlipidemia, unspecified: Secondary | ICD-10-CM | POA: Diagnosis not present

## 2024-08-24 DIAGNOSIS — I7121 Aneurysm of the ascending aorta, without rupture: Secondary | ICD-10-CM | POA: Diagnosis not present

## 2024-08-24 DIAGNOSIS — Z853 Personal history of malignant neoplasm of breast: Secondary | ICD-10-CM | POA: Diagnosis not present

## 2024-08-24 DIAGNOSIS — F5101 Primary insomnia: Secondary | ICD-10-CM | POA: Diagnosis not present

## 2024-08-24 DIAGNOSIS — E559 Vitamin D deficiency, unspecified: Secondary | ICD-10-CM | POA: Diagnosis not present

## 2024-08-24 DIAGNOSIS — Z0000 Encounter for general adult medical examination without abnormal findings: Secondary | ICD-10-CM | POA: Diagnosis not present

## 2024-08-24 DIAGNOSIS — Z8619 Personal history of other infectious and parasitic diseases: Secondary | ICD-10-CM | POA: Diagnosis not present

## 2024-08-24 DIAGNOSIS — I2510 Atherosclerotic heart disease of native coronary artery without angina pectoris: Secondary | ICD-10-CM | POA: Diagnosis not present

## 2024-08-26 DIAGNOSIS — E785 Hyperlipidemia, unspecified: Secondary | ICD-10-CM | POA: Diagnosis not present

## 2024-08-26 DIAGNOSIS — I10 Essential (primary) hypertension: Secondary | ICD-10-CM | POA: Diagnosis not present

## 2024-08-28 ENCOUNTER — Ambulatory Visit (HOSPITAL_COMMUNITY)
Admission: RE | Admit: 2024-08-28 | Discharge: 2024-08-28 | Disposition: A | Discharge status: Home / Self Care | Source: Ambulatory Visit | Attending: Cardiology | Admitting: Cardiology

## 2024-08-28 DIAGNOSIS — I77810 Thoracic aortic ectasia: Secondary | ICD-10-CM

## 2024-08-28 DIAGNOSIS — Z139 Encounter for screening, unspecified: Secondary | ICD-10-CM | POA: Diagnosis not present

## 2024-08-28 DIAGNOSIS — I517 Cardiomegaly: Secondary | ICD-10-CM

## 2024-08-28 DIAGNOSIS — I351 Nonrheumatic aortic (valve) insufficiency: Secondary | ICD-10-CM | POA: Diagnosis not present

## 2024-08-28 DIAGNOSIS — I5030 Unspecified diastolic (congestive) heart failure: Secondary | ICD-10-CM | POA: Diagnosis not present

## 2024-08-28 DIAGNOSIS — R9431 Abnormal electrocardiogram [ECG] [EKG]: Secondary | ICD-10-CM | POA: Diagnosis not present

## 2024-08-28 DIAGNOSIS — I452 Bifascicular block: Secondary | ICD-10-CM | POA: Insufficient documentation

## 2024-08-28 DIAGNOSIS — I083 Combined rheumatic disorders of mitral, aortic and tricuspid valves: Secondary | ICD-10-CM

## 2024-08-28 DIAGNOSIS — I340 Nonrheumatic mitral (valve) insufficiency: Secondary | ICD-10-CM | POA: Diagnosis not present

## 2024-08-28 LAB — ECHOCARDIOGRAM COMPLETE
Area-P 1/2: 4.54 cm2
MV M vel: 5.02 m/s
MV Peak grad: 100.8 mmHg
P 1/2 time: 493 ms
Radius: 0.55 cm
S' Lateral: 3.6 cm

## 2024-09-25 DIAGNOSIS — E785 Hyperlipidemia, unspecified: Secondary | ICD-10-CM | POA: Diagnosis not present

## 2024-09-25 DIAGNOSIS — I10 Essential (primary) hypertension: Secondary | ICD-10-CM | POA: Diagnosis not present

## 2024-09-27 ENCOUNTER — Telehealth (HOSPITAL_BASED_OUTPATIENT_CLINIC_OR_DEPARTMENT_OTHER): Payer: Self-pay | Admitting: *Deleted

## 2024-09-27 NOTE — Telephone Encounter (Signed)
   Patient Name: Sandra Herrera  DOB: 09-17-1946 MRN: [id number]  Primary Cardiologist: Vinie JAYSON Maxcy, MD  Chart reviewed as part of pre-operative protocol coverage.   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 This was performed due to abnormal CT coronary and not an ACS presentation.  Dental extractions of 1-2 teeth are considered low risk procedures per guidelines and generally do not require any specific cardiac clearance. It is also generally accepted that for simple extractions and dental cleanings, there is no need to interrupt blood thinner therapy.  Recommend continuation of ASA without interruption.   SBE prophylaxis is not required for the patient from a cardiac standpoint.  I will route this recommendation to the requesting party via Epic fax function and remove from pre-op pool.  Please call with questions.  Jon Garre Absalom Aro, PA 09/27/2024, 2:59 PM

## 2024-09-27 NOTE — Telephone Encounter (Signed)
   Pre-operative Risk Assessment    Patient Name: Sandra Herrera  DOB: 23-Aug-1946 MRN: [id number]   Date of last office visit: 07/10/24 DR. HILTY Date of next office visit: NONE  Request for Surgical Clearance    Procedure:  Dental Extraction - Amount of Teeth to be Pulled:  1 TOOTH SURGICALLY EXTRACTED   Date of Surgery:  Clearance TBD                                Surgeon:  DR. ARTIST FESS, DMD Surgeon's Group or Practice Name:  [facility] Phone number:  [phone] Fax number:  [phone]   Type of Clearance Requested:   - Medical  - Pharmacy:  Hold Aspirin  ; ALSO DOES PT NEED SBE BEFORE ANY DENTAL VISITS   Type of Anesthesia:  Local    Additional requests/questions:    [name]   09/27/2024, 2:36 PM

## 2024-10-23 DIAGNOSIS — I10 Essential (primary) hypertension: Secondary | ICD-10-CM | POA: Diagnosis not present

## 2024-10-23 DIAGNOSIS — Z1211 Encounter for screening for malignant neoplasm of colon: Secondary | ICD-10-CM | POA: Diagnosis not present

## 2024-10-23 DIAGNOSIS — I7121 Aneurysm of the ascending aorta, without rupture: Secondary | ICD-10-CM | POA: Diagnosis not present
